# Patient Record
Sex: Male | Born: 1945 | Race: White | Hispanic: No | Smoking: Never smoker
Health system: Southern US, Community
[De-identification: ages and names within clinical notes are randomized; demographics above are authoritative.]

## PROBLEM LIST (undated history)

## (undated) DIAGNOSIS — F1010 Alcohol abuse, uncomplicated: Secondary | ICD-10-CM

## (undated) HISTORY — PX: STENT PLACEMENT VASCULAR (ARMC HX): HXRAD1737

---

## 2004-01-01 ENCOUNTER — Observation Stay (HOSPITAL_COMMUNITY): Admission: AD | Admit: 2004-01-01 | Discharge: 2004-01-02 | Payer: Self-pay | Admitting: Cardiology

## 2011-05-18 DIAGNOSIS — J13 Pneumonia due to Streptococcus pneumoniae: Secondary | ICD-10-CM | POA: Diagnosis not present

## 2011-07-13 DIAGNOSIS — I2510 Atherosclerotic heart disease of native coronary artery without angina pectoris: Secondary | ICD-10-CM | POA: Diagnosis not present

## 2011-07-13 DIAGNOSIS — R0989 Other specified symptoms and signs involving the circulatory and respiratory systems: Secondary | ICD-10-CM | POA: Diagnosis not present

## 2011-07-13 DIAGNOSIS — F17200 Nicotine dependence, unspecified, uncomplicated: Secondary | ICD-10-CM | POA: Diagnosis not present

## 2011-07-13 DIAGNOSIS — E785 Hyperlipidemia, unspecified: Secondary | ICD-10-CM | POA: Diagnosis not present

## 2011-07-13 DIAGNOSIS — Z79899 Other long term (current) drug therapy: Secondary | ICD-10-CM | POA: Diagnosis not present

## 2011-11-25 DIAGNOSIS — S61209A Unspecified open wound of unspecified finger without damage to nail, initial encounter: Secondary | ICD-10-CM | POA: Diagnosis not present

## 2011-11-25 DIAGNOSIS — W298XXA Contact with other powered powered hand tools and household machinery, initial encounter: Secondary | ICD-10-CM | POA: Diagnosis not present

## 2011-11-25 DIAGNOSIS — I2510 Atherosclerotic heart disease of native coronary artery without angina pectoris: Secondary | ICD-10-CM | POA: Diagnosis not present

## 2012-02-06 DIAGNOSIS — Z125 Encounter for screening for malignant neoplasm of prostate: Secondary | ICD-10-CM | POA: Diagnosis not present

## 2012-02-06 DIAGNOSIS — R05 Cough: Secondary | ICD-10-CM | POA: Diagnosis not present

## 2012-02-06 DIAGNOSIS — R197 Diarrhea, unspecified: Secondary | ICD-10-CM | POA: Diagnosis not present

## 2012-02-06 DIAGNOSIS — R1033 Periumbilical pain: Secondary | ICD-10-CM | POA: Diagnosis not present

## 2012-02-06 DIAGNOSIS — R634 Abnormal weight loss: Secondary | ICD-10-CM | POA: Diagnosis not present

## 2012-02-06 DIAGNOSIS — E785 Hyperlipidemia, unspecified: Secondary | ICD-10-CM | POA: Diagnosis not present

## 2012-02-07 DIAGNOSIS — R059 Cough, unspecified: Secondary | ICD-10-CM | POA: Diagnosis not present

## 2012-02-07 DIAGNOSIS — R1033 Periumbilical pain: Secondary | ICD-10-CM | POA: Diagnosis not present

## 2012-02-07 DIAGNOSIS — R05 Cough: Secondary | ICD-10-CM | POA: Diagnosis not present

## 2012-02-07 DIAGNOSIS — R918 Other nonspecific abnormal finding of lung field: Secondary | ICD-10-CM | POA: Diagnosis not present

## 2012-02-07 DIAGNOSIS — I708 Atherosclerosis of other arteries: Secondary | ICD-10-CM | POA: Diagnosis not present

## 2012-02-07 DIAGNOSIS — R634 Abnormal weight loss: Secondary | ICD-10-CM | POA: Diagnosis not present

## 2012-02-07 DIAGNOSIS — R197 Diarrhea, unspecified: Secondary | ICD-10-CM | POA: Diagnosis not present

## 2012-02-07 DIAGNOSIS — F17200 Nicotine dependence, unspecified, uncomplicated: Secondary | ICD-10-CM | POA: Diagnosis not present

## 2012-02-13 DIAGNOSIS — J4489 Other specified chronic obstructive pulmonary disease: Secondary | ICD-10-CM | POA: Diagnosis not present

## 2012-02-13 DIAGNOSIS — H6120 Impacted cerumen, unspecified ear: Secondary | ICD-10-CM | POA: Diagnosis not present

## 2012-02-13 DIAGNOSIS — J449 Chronic obstructive pulmonary disease, unspecified: Secondary | ICD-10-CM | POA: Diagnosis not present

## 2012-02-13 DIAGNOSIS — Z681 Body mass index (BMI) 19 or less, adult: Secondary | ICD-10-CM | POA: Diagnosis not present

## 2012-02-16 DIAGNOSIS — R197 Diarrhea, unspecified: Secondary | ICD-10-CM | POA: Diagnosis not present

## 2012-02-16 DIAGNOSIS — R634 Abnormal weight loss: Secondary | ICD-10-CM | POA: Diagnosis not present

## 2012-02-27 DIAGNOSIS — R197 Diarrhea, unspecified: Secondary | ICD-10-CM | POA: Diagnosis not present

## 2012-02-27 DIAGNOSIS — E785 Hyperlipidemia, unspecified: Secondary | ICD-10-CM | POA: Diagnosis not present

## 2012-02-27 DIAGNOSIS — R634 Abnormal weight loss: Secondary | ICD-10-CM | POA: Diagnosis not present

## 2012-02-27 DIAGNOSIS — I739 Peripheral vascular disease, unspecified: Secondary | ICD-10-CM | POA: Diagnosis not present

## 2012-02-27 DIAGNOSIS — I2510 Atherosclerotic heart disease of native coronary artery without angina pectoris: Secondary | ICD-10-CM | POA: Diagnosis not present

## 2012-02-27 DIAGNOSIS — F17200 Nicotine dependence, unspecified, uncomplicated: Secondary | ICD-10-CM | POA: Diagnosis not present

## 2012-02-27 DIAGNOSIS — R0989 Other specified symptoms and signs involving the circulatory and respiratory systems: Secondary | ICD-10-CM | POA: Diagnosis not present

## 2012-03-11 DIAGNOSIS — Z1211 Encounter for screening for malignant neoplasm of colon: Secondary | ICD-10-CM | POA: Diagnosis not present

## 2012-03-11 DIAGNOSIS — R197 Diarrhea, unspecified: Secondary | ICD-10-CM | POA: Diagnosis not present

## 2012-03-11 DIAGNOSIS — K5289 Other specified noninfective gastroenteritis and colitis: Secondary | ICD-10-CM | POA: Diagnosis not present

## 2012-03-11 DIAGNOSIS — K921 Melena: Secondary | ICD-10-CM | POA: Diagnosis not present

## 2012-04-18 DIAGNOSIS — K5289 Other specified noninfective gastroenteritis and colitis: Secondary | ICD-10-CM | POA: Diagnosis not present

## 2012-10-25 DIAGNOSIS — F17200 Nicotine dependence, unspecified, uncomplicated: Secondary | ICD-10-CM | POA: Diagnosis not present

## 2012-10-25 DIAGNOSIS — Z79899 Other long term (current) drug therapy: Secondary | ICD-10-CM | POA: Diagnosis not present

## 2012-10-25 DIAGNOSIS — E785 Hyperlipidemia, unspecified: Secondary | ICD-10-CM | POA: Diagnosis not present

## 2012-10-25 DIAGNOSIS — I739 Peripheral vascular disease, unspecified: Secondary | ICD-10-CM | POA: Diagnosis not present

## 2012-10-25 DIAGNOSIS — I2510 Atherosclerotic heart disease of native coronary artery without angina pectoris: Secondary | ICD-10-CM | POA: Diagnosis not present

## 2013-01-16 DIAGNOSIS — E785 Hyperlipidemia, unspecified: Secondary | ICD-10-CM | POA: Diagnosis not present

## 2013-01-16 DIAGNOSIS — I2510 Atherosclerotic heart disease of native coronary artery without angina pectoris: Secondary | ICD-10-CM | POA: Diagnosis not present

## 2013-06-25 DIAGNOSIS — Z79899 Other long term (current) drug therapy: Secondary | ICD-10-CM | POA: Diagnosis not present

## 2013-06-25 DIAGNOSIS — F17200 Nicotine dependence, unspecified, uncomplicated: Secondary | ICD-10-CM | POA: Diagnosis not present

## 2013-06-25 DIAGNOSIS — I2510 Atherosclerotic heart disease of native coronary artery without angina pectoris: Secondary | ICD-10-CM | POA: Diagnosis not present

## 2013-06-25 DIAGNOSIS — E785 Hyperlipidemia, unspecified: Secondary | ICD-10-CM | POA: Diagnosis not present

## 2013-06-25 DIAGNOSIS — I739 Peripheral vascular disease, unspecified: Secondary | ICD-10-CM | POA: Diagnosis not present

## 2013-07-22 DIAGNOSIS — I70219 Atherosclerosis of native arteries of extremities with intermittent claudication, unspecified extremity: Secondary | ICD-10-CM | POA: Diagnosis not present

## 2013-10-08 DIAGNOSIS — I2510 Atherosclerotic heart disease of native coronary artery without angina pectoris: Secondary | ICD-10-CM | POA: Diagnosis not present

## 2013-10-08 DIAGNOSIS — Z1331 Encounter for screening for depression: Secondary | ICD-10-CM | POA: Diagnosis not present

## 2013-10-08 DIAGNOSIS — Z9181 History of falling: Secondary | ICD-10-CM | POA: Diagnosis not present

## 2013-10-08 DIAGNOSIS — Z125 Encounter for screening for malignant neoplasm of prostate: Secondary | ICD-10-CM | POA: Diagnosis not present

## 2013-10-08 DIAGNOSIS — R634 Abnormal weight loss: Secondary | ICD-10-CM | POA: Diagnosis not present

## 2013-10-08 DIAGNOSIS — E785 Hyperlipidemia, unspecified: Secondary | ICD-10-CM | POA: Diagnosis not present

## 2013-11-17 DIAGNOSIS — F17200 Nicotine dependence, unspecified, uncomplicated: Secondary | ICD-10-CM | POA: Diagnosis not present

## 2013-11-17 DIAGNOSIS — Z79899 Other long term (current) drug therapy: Secondary | ICD-10-CM | POA: Diagnosis not present

## 2013-11-17 DIAGNOSIS — R634 Abnormal weight loss: Secondary | ICD-10-CM | POA: Diagnosis not present

## 2014-02-17 DIAGNOSIS — Z79899 Other long term (current) drug therapy: Secondary | ICD-10-CM | POA: Diagnosis not present

## 2014-02-17 DIAGNOSIS — J449 Chronic obstructive pulmonary disease, unspecified: Secondary | ICD-10-CM | POA: Diagnosis not present

## 2014-02-17 DIAGNOSIS — I2510 Atherosclerotic heart disease of native coronary artery without angina pectoris: Secondary | ICD-10-CM | POA: Diagnosis not present

## 2014-02-17 DIAGNOSIS — Z2821 Immunization not carried out because of patient refusal: Secondary | ICD-10-CM | POA: Diagnosis not present

## 2014-02-17 DIAGNOSIS — Z720 Tobacco use: Secondary | ICD-10-CM | POA: Diagnosis not present

## 2014-02-17 DIAGNOSIS — F17200 Nicotine dependence, unspecified, uncomplicated: Secondary | ICD-10-CM | POA: Diagnosis not present

## 2014-02-17 DIAGNOSIS — E785 Hyperlipidemia, unspecified: Secondary | ICD-10-CM | POA: Diagnosis not present

## 2014-06-18 DIAGNOSIS — Z681 Body mass index (BMI) 19 or less, adult: Secondary | ICD-10-CM | POA: Diagnosis not present

## 2014-06-18 DIAGNOSIS — M1990 Unspecified osteoarthritis, unspecified site: Secondary | ICD-10-CM | POA: Diagnosis not present

## 2014-06-18 DIAGNOSIS — Z720 Tobacco use: Secondary | ICD-10-CM | POA: Diagnosis not present

## 2014-06-18 DIAGNOSIS — Z79899 Other long term (current) drug therapy: Secondary | ICD-10-CM | POA: Diagnosis not present

## 2014-06-18 DIAGNOSIS — I2510 Atherosclerotic heart disease of native coronary artery without angina pectoris: Secondary | ICD-10-CM | POA: Diagnosis not present

## 2014-06-18 DIAGNOSIS — E785 Hyperlipidemia, unspecified: Secondary | ICD-10-CM | POA: Diagnosis not present

## 2014-06-18 DIAGNOSIS — J449 Chronic obstructive pulmonary disease, unspecified: Secondary | ICD-10-CM | POA: Diagnosis not present

## 2014-07-09 DIAGNOSIS — L578 Other skin changes due to chronic exposure to nonionizing radiation: Secondary | ICD-10-CM | POA: Diagnosis not present

## 2014-07-09 DIAGNOSIS — D225 Melanocytic nevi of trunk: Secondary | ICD-10-CM | POA: Diagnosis not present

## 2014-07-09 DIAGNOSIS — L820 Inflamed seborrheic keratosis: Secondary | ICD-10-CM | POA: Diagnosis not present

## 2014-07-09 DIAGNOSIS — L821 Other seborrheic keratosis: Secondary | ICD-10-CM | POA: Diagnosis not present

## 2014-07-30 DIAGNOSIS — L300 Nummular dermatitis: Secondary | ICD-10-CM | POA: Diagnosis not present

## 2014-07-30 DIAGNOSIS — L299 Pruritus, unspecified: Secondary | ICD-10-CM | POA: Diagnosis not present

## 2014-08-06 DIAGNOSIS — L300 Nummular dermatitis: Secondary | ICD-10-CM | POA: Diagnosis not present

## 2014-10-20 DIAGNOSIS — Z720 Tobacco use: Secondary | ICD-10-CM | POA: Diagnosis not present

## 2014-10-20 DIAGNOSIS — Z9181 History of falling: Secondary | ICD-10-CM | POA: Diagnosis not present

## 2014-10-20 DIAGNOSIS — Z79899 Other long term (current) drug therapy: Secondary | ICD-10-CM | POA: Diagnosis not present

## 2014-10-20 DIAGNOSIS — Z1389 Encounter for screening for other disorder: Secondary | ICD-10-CM | POA: Diagnosis not present

## 2014-10-20 DIAGNOSIS — J449 Chronic obstructive pulmonary disease, unspecified: Secondary | ICD-10-CM | POA: Diagnosis not present

## 2014-10-20 DIAGNOSIS — E785 Hyperlipidemia, unspecified: Secondary | ICD-10-CM | POA: Diagnosis not present

## 2014-10-20 DIAGNOSIS — Z681 Body mass index (BMI) 19 or less, adult: Secondary | ICD-10-CM | POA: Diagnosis not present

## 2014-10-20 DIAGNOSIS — I2510 Atherosclerotic heart disease of native coronary artery without angina pectoris: Secondary | ICD-10-CM | POA: Diagnosis not present

## 2015-02-22 DIAGNOSIS — I2510 Atherosclerotic heart disease of native coronary artery without angina pectoris: Secondary | ICD-10-CM | POA: Diagnosis not present

## 2015-02-22 DIAGNOSIS — Z79899 Other long term (current) drug therapy: Secondary | ICD-10-CM | POA: Diagnosis not present

## 2015-02-22 DIAGNOSIS — J449 Chronic obstructive pulmonary disease, unspecified: Secondary | ICD-10-CM | POA: Diagnosis not present

## 2015-02-22 DIAGNOSIS — Z720 Tobacco use: Secondary | ICD-10-CM | POA: Diagnosis not present

## 2015-02-22 DIAGNOSIS — Z125 Encounter for screening for malignant neoplasm of prostate: Secondary | ICD-10-CM | POA: Diagnosis not present

## 2015-02-22 DIAGNOSIS — E785 Hyperlipidemia, unspecified: Secondary | ICD-10-CM | POA: Diagnosis not present

## 2015-02-22 DIAGNOSIS — F17200 Nicotine dependence, unspecified, uncomplicated: Secondary | ICD-10-CM | POA: Diagnosis not present

## 2015-02-22 DIAGNOSIS — Z681 Body mass index (BMI) 19 or less, adult: Secondary | ICD-10-CM | POA: Diagnosis not present

## 2015-06-22 DIAGNOSIS — J449 Chronic obstructive pulmonary disease, unspecified: Secondary | ICD-10-CM | POA: Diagnosis not present

## 2015-06-22 DIAGNOSIS — Z681 Body mass index (BMI) 19 or less, adult: Secondary | ICD-10-CM | POA: Diagnosis not present

## 2015-06-22 DIAGNOSIS — I2510 Atherosclerotic heart disease of native coronary artery without angina pectoris: Secondary | ICD-10-CM | POA: Diagnosis not present

## 2015-06-22 DIAGNOSIS — E785 Hyperlipidemia, unspecified: Secondary | ICD-10-CM | POA: Diagnosis not present

## 2015-06-22 DIAGNOSIS — Z720 Tobacco use: Secondary | ICD-10-CM | POA: Diagnosis not present

## 2015-06-22 DIAGNOSIS — F17200 Nicotine dependence, unspecified, uncomplicated: Secondary | ICD-10-CM | POA: Diagnosis not present

## 2015-06-22 DIAGNOSIS — Z1389 Encounter for screening for other disorder: Secondary | ICD-10-CM | POA: Diagnosis not present

## 2015-06-22 DIAGNOSIS — Z79899 Other long term (current) drug therapy: Secondary | ICD-10-CM | POA: Diagnosis not present

## 2015-10-28 DIAGNOSIS — I2510 Atherosclerotic heart disease of native coronary artery without angina pectoris: Secondary | ICD-10-CM | POA: Diagnosis not present

## 2015-10-28 DIAGNOSIS — F17200 Nicotine dependence, unspecified, uncomplicated: Secondary | ICD-10-CM | POA: Diagnosis not present

## 2015-10-28 DIAGNOSIS — J449 Chronic obstructive pulmonary disease, unspecified: Secondary | ICD-10-CM | POA: Diagnosis not present

## 2015-10-28 DIAGNOSIS — M1990 Unspecified osteoarthritis, unspecified site: Secondary | ICD-10-CM | POA: Diagnosis not present

## 2015-10-28 DIAGNOSIS — Z1389 Encounter for screening for other disorder: Secondary | ICD-10-CM | POA: Diagnosis not present

## 2015-10-28 DIAGNOSIS — Z79899 Other long term (current) drug therapy: Secondary | ICD-10-CM | POA: Diagnosis not present

## 2015-10-28 DIAGNOSIS — E785 Hyperlipidemia, unspecified: Secondary | ICD-10-CM | POA: Diagnosis not present

## 2015-10-28 DIAGNOSIS — Z9181 History of falling: Secondary | ICD-10-CM | POA: Diagnosis not present

## 2015-10-28 DIAGNOSIS — Z720 Tobacco use: Secondary | ICD-10-CM | POA: Diagnosis not present

## 2015-10-28 DIAGNOSIS — Z681 Body mass index (BMI) 19 or less, adult: Secondary | ICD-10-CM | POA: Diagnosis not present

## 2016-01-12 ENCOUNTER — Emergency Department (HOSPITAL_COMMUNITY): Payer: Medicare Other

## 2016-01-12 ENCOUNTER — Encounter (HOSPITAL_COMMUNITY): Payer: Self-pay | Admitting: *Deleted

## 2016-01-12 ENCOUNTER — Emergency Department (HOSPITAL_COMMUNITY)
Admission: EM | Admit: 2016-01-12 | Discharge: 2016-01-12 | Disposition: A | Discharge status: Home / Self Care | Payer: Medicare Other | Attending: Emergency Medicine | Admitting: Emergency Medicine

## 2016-01-12 DIAGNOSIS — S61012A Laceration without foreign body of left thumb without damage to nail, initial encounter: Secondary | ICD-10-CM

## 2016-01-12 DIAGNOSIS — Y939 Activity, unspecified: Secondary | ICD-10-CM | POA: Diagnosis not present

## 2016-01-12 DIAGNOSIS — T148 Other injury of unspecified body region: Secondary | ICD-10-CM | POA: Diagnosis not present

## 2016-01-12 DIAGNOSIS — Y999 Unspecified external cause status: Secondary | ICD-10-CM | POA: Diagnosis not present

## 2016-01-12 DIAGNOSIS — W312XXA Contact with powered woodworking and forming machines, initial encounter: Secondary | ICD-10-CM | POA: Diagnosis not present

## 2016-01-12 DIAGNOSIS — S61412A Laceration without foreign body of left hand, initial encounter: Secondary | ICD-10-CM | POA: Diagnosis not present

## 2016-01-12 DIAGNOSIS — S61209A Unspecified open wound of unspecified finger without damage to nail, initial encounter: Secondary | ICD-10-CM | POA: Diagnosis not present

## 2016-01-12 DIAGNOSIS — Y929 Unspecified place or not applicable: Secondary | ICD-10-CM | POA: Insufficient documentation

## 2016-01-12 MED ORDER — ACETAMINOPHEN 500 MG PO TABS
1000.0000 mg | ORAL_TABLET | Freq: Once | ORAL | Status: AC
  Administered 2016-01-12: 1000 mg via ORAL
  Filled 2016-01-12: qty 2

## 2016-01-12 MED ORDER — LIDOCAINE HCL (PF) 1 % IJ SOLN
10.0000 mL | Freq: Once | INTRAMUSCULAR | Status: AC
  Administered 2016-01-12: 10 mL
  Filled 2016-01-12: qty 10

## 2016-01-12 MED ORDER — CEPHALEXIN 500 MG PO CAPS: 500.0000 mg | ORAL_CAPSULE | Freq: Four times a day (QID) | ORAL | 0 refills | Status: DC

## 2016-01-12 NOTE — ED Notes (Signed)
Bandage applied prior to discharge

## 2016-01-12 NOTE — ED Provider Notes (Signed)
[facility] DEPT Provider Note   CSN: [id number] Arrival date & time: 01/12/16  1354     History   Chief Complaint Chief Complaint  Patient presents with  . Hand Injury  . Laceration    HPI Jesse Hawkins is a 70 y.o. male.  Patient presents today with a laceration to the palmar aspect of the left thumb.  He reports that approximately 3 hours ago he cut his finger with a hand saw.  He states that EMS applied pressure to the area, which did control the bleeding.  He is currently on Plavix.  He does report some numbness of the distal aspect of the finger.  He reports that his tetanus is UTD.  Patient is right handed.  He has not taken anything for pain prior to arrival, but was given Tylenol upon arrival in the ED, which helped with the pain.      No past medical history on file.  There are no active problems to display for this patient.   No past surgical history on file.     Home Medications    Prior to Admission medications   Not on File    Family History No family history on file.  Social History Social History  Substance Use Topics  . Smoking status: Never Smoker  . Smokeless tobacco: Never Used  . Alcohol use Not on file     Allergies   Review of patient's allergies indicates not on file.   Review of Systems Review of Systems  All other systems reviewed and are negative.    Physical Exam Updated Vital Signs BP 137/69   Pulse (!) 57   SpO2 99%   Physical Exam  Constitutional: He appears well-developed and well-nourished.  HENT:  Head: Normocephalic and atraumatic.  Neck: Normal range of motion. Neck supple.  Cardiovascular: Normal rate, regular rhythm and normal heart sounds.   Pulmonary/Chest: Effort normal and breath sounds normal.  Musculoskeletal:  ROM of the thumb is limited at the IP joint  Neurological: He is alert.  Distal sensation of the left thumb is intact  Skin:  6 cm u shaped gaping laceration to the finger pad of  the left thumb.  Diffuse soft tissue swelling of the distal thumb.  Laceration is actively bleeding, but bleeding is controlled.  Psychiatric: He has a normal mood and affect.  Nursing note and vitals reviewed.    ED Treatments / Results  Labs (all labs ordered are listed, but only abnormal results are displayed) Labs Reviewed - No data to display  EKG  EKG Interpretation None       Radiology Dg Hand Complete Left  Result Date: 01/12/2016 CLINICAL DATA:  69 year old who lacerated the left thumb with a band saw earlier today. Initial encounter. EXAM: LEFT HAND - COMPLETE 3+ VIEW COMPARISON:  None. FINDINGS: Images were obtained with overlying bandage material. Soft tissue injury involving the thumb. No visible opaque foreign body in the soft tissues. No evidence of acute fracture or dislocation. Moderate narrowing of the IP joint space in the thumb and the IP joint spaces of all the fingers. MCP joint spaces well-preserved. Severe narrowing of the trapezium-1st metacarpal joint space with intra-articular loose bodies. IMPRESSION: 1. No acute osseous abnormality. 2. No visible opaque foreign bodies in the soft tissues. 3. 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 Electronically Signed   By: Evangeline Dakin M.D.   On: 01/12/2016 15:34  Procedures Procedures (including critical care time)  Medications Ordered in ED Medications  lidocaine (PF) (XYLOCAINE) 1 % injection 10 mL (not administered)  acetaminophen (TYLENOL) tablet 1,000 mg (1,000 mg Oral Given 01/12/16 1515)     Initial Impression / Assessment and Plan / ED Course  I have reviewed the triage vital signs and the nursing notes.  Pertinent labs & imaging results that were available during my care of the patient were reviewed by me and considered in my medical decision making (see chart for details).  Clinical Course   4:58  PM Patient discussed with LeAnn from Hand Surgery.  She states that the patient can follow up in the office tomorrow  Final Clinical Impressions(s) / ED Diagnoses   Final diagnoses:  None   Patient presents today with a laceration from a table saw to the palmar aspect of the left thumb.  Xray is negative.  No obvious tendon injury, but ROM limited at the IP joint of the thumb.  Distal sensation is intact.  Laceration is gaping with surrounding edema.  Unable to closely approximate the laceration due to swelling and the gaping nature of the wound.  Bleeding is controlled. Discussed with Hand Surgery.  Patient can follow up in the office tomorrow.  Patient placed on Keflex.  Stable for discharge.  Patient also evaluated by Dr Laverta Baltimore who is in agreement with the plan.   New Prescriptions New Prescriptions   No medications on file     Hyman Bible, PA-C 01/12/16 2326    Margette Fast, MD 01/13/16 [phone]

## 2016-01-12 NOTE — ED Triage Notes (Addendum)
Patient came in post table-saw injury to left thumb. Patient's thumb got caught in saw and now has circumfertial laceration to left thumb. EMS wrapped with kerlix. No loss of thumb tip and no bone showing per EMS. EMS states patient still has full movement. Patient states it's throbbing and numb. Patient takes plavix and aspirin daily.

## 2016-01-24 DIAGNOSIS — S61012A Laceration without foreign body of left thumb without damage to nail, initial encounter: Secondary | ICD-10-CM | POA: Diagnosis not present

## 2016-02-29 DIAGNOSIS — Z79899 Other long term (current) drug therapy: Secondary | ICD-10-CM | POA: Diagnosis not present

## 2016-02-29 DIAGNOSIS — E785 Hyperlipidemia, unspecified: Secondary | ICD-10-CM | POA: Diagnosis not present

## 2016-02-29 DIAGNOSIS — J449 Chronic obstructive pulmonary disease, unspecified: Secondary | ICD-10-CM | POA: Diagnosis not present

## 2016-02-29 DIAGNOSIS — I2510 Atherosclerotic heart disease of native coronary artery without angina pectoris: Secondary | ICD-10-CM | POA: Diagnosis not present

## 2016-02-29 DIAGNOSIS — M1990 Unspecified osteoarthritis, unspecified site: Secondary | ICD-10-CM | POA: Diagnosis not present

## 2016-02-29 DIAGNOSIS — Z681 Body mass index (BMI) 19 or less, adult: Secondary | ICD-10-CM | POA: Diagnosis not present

## 2016-04-03 DIAGNOSIS — M9903 Segmental and somatic dysfunction of lumbar region: Secondary | ICD-10-CM | POA: Diagnosis not present

## 2016-04-03 DIAGNOSIS — M9902 Segmental and somatic dysfunction of thoracic region: Secondary | ICD-10-CM | POA: Diagnosis not present

## 2016-04-03 DIAGNOSIS — M545 Low back pain: Secondary | ICD-10-CM | POA: Diagnosis not present

## 2016-04-03 DIAGNOSIS — M9905 Segmental and somatic dysfunction of pelvic region: Secondary | ICD-10-CM | POA: Diagnosis not present

## 2016-04-05 DIAGNOSIS — M9902 Segmental and somatic dysfunction of thoracic region: Secondary | ICD-10-CM | POA: Diagnosis not present

## 2016-04-05 DIAGNOSIS — M9903 Segmental and somatic dysfunction of lumbar region: Secondary | ICD-10-CM | POA: Diagnosis not present

## 2016-04-05 DIAGNOSIS — M9905 Segmental and somatic dysfunction of pelvic region: Secondary | ICD-10-CM | POA: Diagnosis not present

## 2016-04-05 DIAGNOSIS — M545 Low back pain: Secondary | ICD-10-CM | POA: Diagnosis not present

## 2016-04-07 DIAGNOSIS — M545 Low back pain: Secondary | ICD-10-CM | POA: Diagnosis not present

## 2016-04-07 DIAGNOSIS — M9905 Segmental and somatic dysfunction of pelvic region: Secondary | ICD-10-CM | POA: Diagnosis not present

## 2016-04-07 DIAGNOSIS — M9902 Segmental and somatic dysfunction of thoracic region: Secondary | ICD-10-CM | POA: Diagnosis not present

## 2016-04-07 DIAGNOSIS — M9903 Segmental and somatic dysfunction of lumbar region: Secondary | ICD-10-CM | POA: Diagnosis not present

## 2016-05-17 DIAGNOSIS — Z1211 Encounter for screening for malignant neoplasm of colon: Secondary | ICD-10-CM | POA: Diagnosis not present

## 2016-05-17 DIAGNOSIS — Z125 Encounter for screening for malignant neoplasm of prostate: Secondary | ICD-10-CM | POA: Diagnosis not present

## 2016-05-17 DIAGNOSIS — Z136 Encounter for screening for cardiovascular disorders: Secondary | ICD-10-CM | POA: Diagnosis not present

## 2016-05-17 DIAGNOSIS — Z9181 History of falling: Secondary | ICD-10-CM | POA: Diagnosis not present

## 2016-05-17 DIAGNOSIS — Z1389 Encounter for screening for other disorder: Secondary | ICD-10-CM | POA: Diagnosis not present

## 2016-05-17 DIAGNOSIS — Z0000 Encounter for general adult medical examination without abnormal findings: Secondary | ICD-10-CM | POA: Diagnosis not present

## 2016-06-28 DIAGNOSIS — J449 Chronic obstructive pulmonary disease, unspecified: Secondary | ICD-10-CM | POA: Diagnosis not present

## 2016-06-28 DIAGNOSIS — I2510 Atherosclerotic heart disease of native coronary artery without angina pectoris: Secondary | ICD-10-CM | POA: Diagnosis not present

## 2016-06-28 DIAGNOSIS — E785 Hyperlipidemia, unspecified: Secondary | ICD-10-CM | POA: Diagnosis not present

## 2016-06-28 DIAGNOSIS — Z125 Encounter for screening for malignant neoplasm of prostate: Secondary | ICD-10-CM | POA: Diagnosis not present

## 2016-06-28 DIAGNOSIS — Z681 Body mass index (BMI) 19 or less, adult: Secondary | ICD-10-CM | POA: Diagnosis not present

## 2016-06-28 DIAGNOSIS — M1990 Unspecified osteoarthritis, unspecified site: Secondary | ICD-10-CM | POA: Diagnosis not present

## 2016-06-28 DIAGNOSIS — Z720 Tobacco use: Secondary | ICD-10-CM | POA: Diagnosis not present

## 2016-06-28 DIAGNOSIS — Z79899 Other long term (current) drug therapy: Secondary | ICD-10-CM | POA: Diagnosis not present

## 2016-11-06 DIAGNOSIS — M1990 Unspecified osteoarthritis, unspecified site: Secondary | ICD-10-CM | POA: Diagnosis not present

## 2016-11-06 DIAGNOSIS — Z720 Tobacco use: Secondary | ICD-10-CM | POA: Diagnosis not present

## 2016-11-06 DIAGNOSIS — E785 Hyperlipidemia, unspecified: Secondary | ICD-10-CM | POA: Diagnosis not present

## 2016-11-06 DIAGNOSIS — Z79899 Other long term (current) drug therapy: Secondary | ICD-10-CM | POA: Diagnosis not present

## 2016-11-06 DIAGNOSIS — Z125 Encounter for screening for malignant neoplasm of prostate: Secondary | ICD-10-CM | POA: Diagnosis not present

## 2016-11-06 DIAGNOSIS — J449 Chronic obstructive pulmonary disease, unspecified: Secondary | ICD-10-CM | POA: Diagnosis not present

## 2016-11-06 DIAGNOSIS — M5432 Sciatica, left side: Secondary | ICD-10-CM | POA: Diagnosis not present

## 2016-11-06 DIAGNOSIS — Z681 Body mass index (BMI) 19 or less, adult: Secondary | ICD-10-CM | POA: Diagnosis not present

## 2016-11-06 DIAGNOSIS — I2510 Atherosclerotic heart disease of native coronary artery without angina pectoris: Secondary | ICD-10-CM | POA: Diagnosis not present

## 2017-03-12 DIAGNOSIS — M1990 Unspecified osteoarthritis, unspecified site: Secondary | ICD-10-CM | POA: Diagnosis not present

## 2017-03-12 DIAGNOSIS — I2510 Atherosclerotic heart disease of native coronary artery without angina pectoris: Secondary | ICD-10-CM | POA: Diagnosis not present

## 2017-03-12 DIAGNOSIS — M5432 Sciatica, left side: Secondary | ICD-10-CM | POA: Diagnosis not present

## 2017-03-12 DIAGNOSIS — Z720 Tobacco use: Secondary | ICD-10-CM | POA: Diagnosis not present

## 2017-03-12 DIAGNOSIS — Z681 Body mass index (BMI) 19 or less, adult: Secondary | ICD-10-CM | POA: Diagnosis not present

## 2017-03-12 DIAGNOSIS — Z87891 Personal history of nicotine dependence: Secondary | ICD-10-CM | POA: Diagnosis not present

## 2017-03-12 DIAGNOSIS — Z79899 Other long term (current) drug therapy: Secondary | ICD-10-CM | POA: Diagnosis not present

## 2017-03-12 DIAGNOSIS — J449 Chronic obstructive pulmonary disease, unspecified: Secondary | ICD-10-CM | POA: Diagnosis not present

## 2017-03-12 DIAGNOSIS — E785 Hyperlipidemia, unspecified: Secondary | ICD-10-CM | POA: Diagnosis not present

## 2017-07-10 DIAGNOSIS — Z720 Tobacco use: Secondary | ICD-10-CM | POA: Diagnosis not present

## 2017-07-10 DIAGNOSIS — M1990 Unspecified osteoarthritis, unspecified site: Secondary | ICD-10-CM | POA: Diagnosis not present

## 2017-07-10 DIAGNOSIS — M5432 Sciatica, left side: Secondary | ICD-10-CM | POA: Diagnosis not present

## 2017-07-10 DIAGNOSIS — Z9181 History of falling: Secondary | ICD-10-CM | POA: Diagnosis not present

## 2017-07-10 DIAGNOSIS — I2510 Atherosclerotic heart disease of native coronary artery without angina pectoris: Secondary | ICD-10-CM | POA: Diagnosis not present

## 2017-07-10 DIAGNOSIS — Z87891 Personal history of nicotine dependence: Secondary | ICD-10-CM | POA: Diagnosis not present

## 2017-07-10 DIAGNOSIS — J449 Chronic obstructive pulmonary disease, unspecified: Secondary | ICD-10-CM | POA: Diagnosis not present

## 2017-07-10 DIAGNOSIS — Z1331 Encounter for screening for depression: Secondary | ICD-10-CM | POA: Diagnosis not present

## 2017-07-10 DIAGNOSIS — Z1389 Encounter for screening for other disorder: Secondary | ICD-10-CM | POA: Diagnosis not present

## 2017-07-10 DIAGNOSIS — E785 Hyperlipidemia, unspecified: Secondary | ICD-10-CM | POA: Diagnosis not present

## 2017-07-10 DIAGNOSIS — Z6821 Body mass index (BMI) 21.0-21.9, adult: Secondary | ICD-10-CM | POA: Diagnosis not present

## 2017-09-21 ENCOUNTER — Emergency Department (HOSPITAL_COMMUNITY): Payer: Medicare Other | Admitting: Certified Registered"

## 2017-09-21 ENCOUNTER — Encounter (HOSPITAL_COMMUNITY): Payer: Self-pay

## 2017-09-21 ENCOUNTER — Emergency Department (HOSPITAL_COMMUNITY): Payer: Medicare Other

## 2017-09-21 ENCOUNTER — Other Ambulatory Visit: Payer: Self-pay

## 2017-09-21 ENCOUNTER — Inpatient Hospital Stay (HOSPITAL_COMMUNITY)
Admission: EM | Admit: 2017-09-21 | Discharge: 2017-09-22 | DRG: 581 | Disposition: A | Discharge status: Home / Self Care | Payer: Medicare Other | Attending: Orthopedic Surgery | Admitting: Orthopedic Surgery

## 2017-09-21 ENCOUNTER — Encounter (HOSPITAL_COMMUNITY)
Admission: EM | Disposition: A | Discharge status: Home / Self Care | Payer: Self-pay | Source: Home / Self Care | Attending: Orthopedic Surgery

## 2017-09-21 DIAGNOSIS — S6992XA Unspecified injury of left wrist, hand and finger(s), initial encounter: Secondary | ICD-10-CM | POA: Diagnosis not present

## 2017-09-21 DIAGNOSIS — R000 Tachycardia, unspecified: Secondary | ICD-10-CM | POA: Diagnosis not present

## 2017-09-21 DIAGNOSIS — S68127A Partial traumatic metacarpophalangeal amputation of left little finger, initial encounter: Secondary | ICD-10-CM | POA: Diagnosis not present

## 2017-09-21 DIAGNOSIS — S6990XA Unspecified injury of unspecified wrist, hand and finger(s), initial encounter: Secondary | ICD-10-CM

## 2017-09-21 DIAGNOSIS — W312XXA Contact with powered woodworking and forming machines, initial encounter: Secondary | ICD-10-CM

## 2017-09-21 DIAGNOSIS — Z7902 Long term (current) use of antithrombotics/antiplatelets: Secondary | ICD-10-CM

## 2017-09-21 DIAGNOSIS — F1010 Alcohol abuse, uncomplicated: Secondary | ICD-10-CM | POA: Diagnosis not present

## 2017-09-21 DIAGNOSIS — S61217A Laceration without foreign body of left little finger without damage to nail, initial encounter: Secondary | ICD-10-CM | POA: Diagnosis not present

## 2017-09-21 DIAGNOSIS — S68022A Partial traumatic metacarpophalangeal amputation of left thumb, initial encounter: Secondary | ICD-10-CM | POA: Diagnosis not present

## 2017-09-21 DIAGNOSIS — R279 Unspecified lack of coordination: Secondary | ICD-10-CM | POA: Diagnosis not present

## 2017-09-21 DIAGNOSIS — Z743 Need for continuous supervision: Secondary | ICD-10-CM | POA: Diagnosis not present

## 2017-09-21 DIAGNOSIS — M79643 Pain in unspecified hand: Secondary | ICD-10-CM | POA: Diagnosis not present

## 2017-09-21 DIAGNOSIS — S66822A Laceration of other specified muscles, fascia and tendons at wrist and hand level, left hand, initial encounter: Secondary | ICD-10-CM | POA: Diagnosis not present

## 2017-09-21 DIAGNOSIS — I2510 Atherosclerotic heart disease of native coronary artery without angina pectoris: Secondary | ICD-10-CM | POA: Diagnosis present

## 2017-09-21 DIAGNOSIS — S68117A Complete traumatic metacarpophalangeal amputation of left little finger, initial encounter: Secondary | ICD-10-CM | POA: Diagnosis not present

## 2017-09-21 DIAGNOSIS — S61412A Laceration without foreign body of left hand, initial encounter: Secondary | ICD-10-CM | POA: Diagnosis not present

## 2017-09-21 DIAGNOSIS — S6422XA Injury of radial nerve at wrist and hand level of left arm, initial encounter: Secondary | ICD-10-CM | POA: Diagnosis present

## 2017-09-21 DIAGNOSIS — S66125A Laceration of flexor muscle, fascia and tendon of left ring finger at wrist and hand level, initial encounter: Secondary | ICD-10-CM | POA: Diagnosis not present

## 2017-09-21 DIAGNOSIS — M79642 Pain in left hand: Secondary | ICD-10-CM | POA: Diagnosis present

## 2017-09-21 DIAGNOSIS — Z7982 Long term (current) use of aspirin: Secondary | ICD-10-CM

## 2017-09-21 DIAGNOSIS — M24842 Other specific joint derangements of left hand, not elsewhere classified: Secondary | ICD-10-CM | POA: Diagnosis not present

## 2017-09-21 DIAGNOSIS — R0902 Hypoxemia: Secondary | ICD-10-CM | POA: Diagnosis not present

## 2017-09-21 DIAGNOSIS — I10 Essential (primary) hypertension: Secondary | ICD-10-CM | POA: Diagnosis not present

## 2017-09-21 DIAGNOSIS — R52 Pain, unspecified: Secondary | ICD-10-CM | POA: Diagnosis not present

## 2017-09-21 DIAGNOSIS — S61419A Laceration without foreign body of unspecified hand, initial encounter: Secondary | ICD-10-CM | POA: Diagnosis present

## 2017-09-21 DIAGNOSIS — S648X2A Injury of other nerves at wrist and hand level of left arm, initial encounter: Secondary | ICD-10-CM | POA: Diagnosis not present

## 2017-09-21 DIAGNOSIS — Z955 Presence of coronary angioplasty implant and graft: Secondary | ICD-10-CM | POA: Diagnosis not present

## 2017-09-21 DIAGNOSIS — S6982XA Other specified injuries of left wrist, hand and finger(s), initial encounter: Secondary | ICD-10-CM | POA: Diagnosis not present

## 2017-09-21 HISTORY — PX: TENDON REPAIR: SHX5111

## 2017-09-21 HISTORY — DX: Alcohol abuse, uncomplicated: F10.10

## 2017-09-21 LAB — BASIC METABOLIC PANEL
ANION GAP: 8 (ref 5–15)
BUN: 10 mg/dL (ref 6–20)
CALCIUM: 8.2 mg/dL — AB (ref 8.9–10.3)
CO2: 22 mmol/L (ref 22–32)
Chloride: 110 mmol/L (ref 101–111)
Creatinine, Ser: 1.05 mg/dL (ref 0.61–1.24)
GFR calc non Af Amer: >60 mL/min (ref >60)
Glucose, Bld: 111 mg/dL — ABNORMAL HIGH (ref 65–99)
Potassium: 4.2 mmol/L (ref 3.5–5.1)
SODIUM: 140 mmol/L (ref 135–145)

## 2017-09-21 LAB — I-STAT CHEM 8, ED
BUN: 11 mg/dL (ref 6–20)
Calcium, Ion: 1.14 mmol/L — ABNORMAL LOW (ref 1.15–1.40)
Chloride: 107 mmol/L (ref 101–111)
Creatinine, Ser: 1 mg/dL (ref 0.61–1.24)
Glucose, Bld: 105 mg/dL — ABNORMAL HIGH (ref 65–99)
HEMATOCRIT: 33 % — AB (ref 39.0–52.0)
HEMOGLOBIN: 11.2 g/dL — AB (ref 13.0–17.0)
POTASSIUM: 4.2 mmol/L (ref 3.5–5.1)
SODIUM: 141 mmol/L (ref 135–145)
TCO2: 22 mmol/L (ref 22–32)

## 2017-09-21 LAB — TYPE AND SCREEN
ABO/RH(D): O POS
ANTIBODY SCREEN: NEGATIVE

## 2017-09-21 LAB — CBC
HEMATOCRIT: 33.7 % — AB (ref 39.0–52.0)
HEMOGLOBIN: 11.1 g/dL — AB (ref 13.0–17.0)
MCH: 31.1 pg (ref 26.0–34.0)
MCHC: 32.9 g/dL (ref 30.0–36.0)
MCV: 94.4 fL (ref 78.0–100.0)
Platelets: 147 10*3/uL — ABNORMAL LOW (ref 150–400)
RBC: 3.57 MIL/uL — ABNORMAL LOW (ref 4.22–5.81)
RDW: 14.3 % (ref 11.5–15.5)
WBC: 10.4 10*3/uL (ref 4.0–10.5)

## 2017-09-21 LAB — ABO/RH: ABO/RH(D): O POS

## 2017-09-21 SURGERY — TENDON REPAIR
Anesthesia: General | Site: Hand | Laterality: Left

## 2017-09-21 MED ORDER — LACTATED RINGERS IV SOLN
INTRAVENOUS | Status: DC
  Administered 2017-09-21 (×3): via INTRAVENOUS

## 2017-09-21 MED ORDER — EPHEDRINE SULFATE 50 MG/ML IJ SOLN
INTRAMUSCULAR | Status: DC | PRN
  Administered 2017-09-21 (×2): 5 mg via INTRAVENOUS

## 2017-09-21 MED ORDER — PHENYLEPHRINE 40 MCG/ML (10ML) SYRINGE FOR IV PUSH (FOR BLOOD PRESSURE SUPPORT)
PREFILLED_SYRINGE | INTRAVENOUS | Status: DC | PRN
  Administered 2017-09-21 (×2): 80 ug via INTRAVENOUS

## 2017-09-21 MED ORDER — ACETAMINOPHEN 325 MG PO TABS
650.0000 mg | ORAL_TABLET | Freq: Three times a day (TID) | ORAL | Status: DC
  Administered 2017-09-22 (×2): 650 mg via ORAL
  Filled 2017-09-21 (×2): qty 2

## 2017-09-21 MED ORDER — CEFAZOLIN SODIUM-DEXTROSE 1-4 GM/50ML-% IV SOLN
INTRAVENOUS | Status: AC
  Filled 2017-09-21: qty 50

## 2017-09-21 MED ORDER — PROPOFOL 10 MG/ML IV BOLUS
INTRAVENOUS | Status: DC | PRN
  Administered 2017-09-21: 120 mg via INTRAVENOUS

## 2017-09-21 MED ORDER — MIDAZOLAM HCL 5 MG/5ML IJ SOLN
INTRAMUSCULAR | Status: DC | PRN
  Administered 2017-09-21: 2 mg via INTRAVENOUS

## 2017-09-21 MED ORDER — CEFAZOLIN SODIUM-DEXTROSE 1-4 GM/50ML-% IV SOLN
INTRAVENOUS | Status: DC | PRN
  Administered 2017-09-21 (×2): 1 g via INTRAVENOUS

## 2017-09-21 MED ORDER — DEXAMETHASONE SODIUM PHOSPHATE 10 MG/ML IJ SOLN
INTRAMUSCULAR | Status: DC | PRN
  Administered 2017-09-21: 10 mg via INTRAVENOUS

## 2017-09-21 MED ORDER — ATORVASTATIN CALCIUM 20 MG PO TABS
20.0000 mg | ORAL_TABLET | Freq: Every day | ORAL | Status: DC
  Administered 2017-09-22: 20 mg via ORAL
  Filled 2017-09-21: qty 1

## 2017-09-21 MED ORDER — SUGAMMADEX SODIUM 200 MG/2ML IV SOLN
INTRAVENOUS | Status: DC | PRN
  Administered 2017-09-21: 120 mg via INTRAVENOUS

## 2017-09-21 MED ORDER — DEXTROSE 5 % IV SOLN
INTRAVENOUS | Status: DC | PRN
  Administered 2017-09-21: 50 ug/min via INTRAVENOUS

## 2017-09-21 MED ORDER — ONDANSETRON HCL 4 MG/2ML IJ SOLN
INTRAMUSCULAR | Status: DC | PRN
  Administered 2017-09-21: 4 mg via INTRAVENOUS

## 2017-09-21 MED ORDER — SODIUM CHLORIDE 0.9 % IV SOLN
INTRAVENOUS | Status: AC
  Filled 2017-09-21: qty 1.2

## 2017-09-21 MED ORDER — 0.9 % SODIUM CHLORIDE (POUR BTL) OPTIME
TOPICAL | Status: DC | PRN
  Administered 2017-09-21 (×2): 1000 mL

## 2017-09-21 MED ORDER — MORPHINE BOLUS VIA INFUSION: 1.0000 mg | INTRAVENOUS | Status: DC | PRN

## 2017-09-21 MED ORDER — HEPARIN SODIUM (PORCINE) 5000 UNIT/ML IJ SOLN
INTRAMUSCULAR | Status: DC | PRN
  Administered 2017-09-21: 20:00:00

## 2017-09-21 MED ORDER — DOCUSATE SODIUM 100 MG PO CAPS
100.0000 mg | ORAL_CAPSULE | Freq: Two times a day (BID) | ORAL | Status: DC
  Administered 2017-09-22: 100 mg via ORAL
  Filled 2017-09-21: qty 1

## 2017-09-21 MED ORDER — CLOPIDOGREL BISULFATE 75 MG PO TABS
75.0000 mg | ORAL_TABLET | Freq: Every day | ORAL | Status: DC
  Administered 2017-09-22: 75 mg via ORAL
  Filled 2017-09-21: qty 1

## 2017-09-21 MED ORDER — CEFAZOLIN SODIUM-DEXTROSE 2-4 GM/100ML-% IV SOLN
2.0000 g | Freq: Four times a day (QID) | INTRAVENOUS | Status: DC
  Administered 2017-09-22 (×2): 2 g via INTRAVENOUS
  Filled 2017-09-21 (×2): qty 100

## 2017-09-21 MED ORDER — FENTANYL CITRATE (PF) 250 MCG/5ML IJ SOLN
INTRAMUSCULAR | Status: AC
  Filled 2017-09-21: qty 5

## 2017-09-21 MED ORDER — CEFAZOLIN SODIUM-DEXTROSE 1-4 GM/50ML-% IV SOLN
1.0000 g | Freq: Once | INTRAVENOUS | Status: AC
  Administered 2017-09-21: 1 g via INTRAVENOUS
  Filled 2017-09-21: qty 50

## 2017-09-21 MED ORDER — BUPIVACAINE HCL (PF) 0.25 % IJ SOLN
INTRAMUSCULAR | Status: AC
  Filled 2017-09-21: qty 30

## 2017-09-21 MED ORDER — ONDANSETRON HCL 4 MG PO TABS: 4.0000 mg | ORAL_TABLET | Freq: Four times a day (QID) | ORAL | Status: DC | PRN

## 2017-09-21 MED ORDER — HYDROMORPHONE HCL 2 MG/ML IJ SOLN: 0.2500 mg | INTRAMUSCULAR | Status: DC | PRN

## 2017-09-21 MED ORDER — ROCURONIUM BROMIDE 100 MG/10ML IV SOLN
INTRAVENOUS | Status: DC | PRN
  Administered 2017-09-21: 50 mg via INTRAVENOUS

## 2017-09-21 MED ORDER — ONDANSETRON HCL 4 MG/2ML IJ SOLN: 4.0000 mg | Freq: Four times a day (QID) | INTRAMUSCULAR | Status: DC | PRN

## 2017-09-21 MED ORDER — LIDOCAINE HCL (CARDIAC) PF 100 MG/5ML IV SOSY
PREFILLED_SYRINGE | INTRAVENOUS | Status: DC | PRN
  Administered 2017-09-21: 60 mg via INTRAVENOUS

## 2017-09-21 MED ORDER — OXYCODONE HCL 5 MG PO TABS: 5.0000 mg | ORAL_TABLET | Freq: Four times a day (QID) | ORAL | Status: DC | PRN

## 2017-09-21 MED ORDER — FENTANYL CITRATE (PF) 100 MCG/2ML IJ SOLN
INTRAMUSCULAR | Status: DC | PRN
  Administered 2017-09-21: 100 ug via INTRAVENOUS
  Administered 2017-09-21 (×3): 50 ug via INTRAVENOUS

## 2017-09-21 MED ORDER — ARTIFICIAL TEARS OPHTHALMIC OINT
TOPICAL_OINTMENT | OPHTHALMIC | Status: DC | PRN
  Administered 2017-09-21: 1 via OPHTHALMIC

## 2017-09-21 MED ORDER — SODIUM CHLORIDE 0.9 % IV BOLUS
1000.0000 mL | Freq: Once | INTRAVENOUS | Status: AC
  Administered 2017-09-21: 1000 mL via INTRAVENOUS

## 2017-09-21 MED ORDER — TETANUS-DIPHTH-ACELL PERTUSSIS 5-2.5-18.5 LF-MCG/0.5 IM SUSP: 0.5000 mL | Freq: Once | INTRAMUSCULAR | Status: DC

## 2017-09-21 MED ORDER — ASPIRIN 81 MG PO CHEW
81.0000 mg | CHEWABLE_TABLET | Freq: Every day | ORAL | Status: DC
  Administered 2017-09-22: 81 mg via ORAL
  Filled 2017-09-21: qty 1

## 2017-09-21 SURGICAL SUPPLY — 47 items
ANCHOR JUGGERKNOT 1.0 1DR 2-0 (Anchor) ×3 IMPLANT
BAG DECANTER FOR FLEXI CONT (MISCELLANEOUS) ×3 IMPLANT
BLADE SURG 15 STRL LF DISP TIS (BLADE) ×1 IMPLANT
BLADE SURG 15 STRL SS (BLADE) ×2
BNDG COHESIVE 4X5 TAN STRL (GAUZE/BANDAGES/DRESSINGS) ×3 IMPLANT
BNDG ESMARK 4X9 LF (GAUZE/BANDAGES/DRESSINGS) IMPLANT
BNDG GAUZE ELAST 4 BULKY (GAUZE/BANDAGES/DRESSINGS) ×3 IMPLANT
CANISTER SUCT 3000ML PPV (MISCELLANEOUS) ×3 IMPLANT
CONNECTOR NERVE AXOGUARD 3X15 (Orthopedic Implant) ×3 IMPLANT
CORDS BIPOLAR (ELECTRODE) ×3 IMPLANT
COVER SURGICAL LIGHT HANDLE (MISCELLANEOUS) ×3 IMPLANT
CUFF TOURNIQUET SINGLE 18IN (TOURNIQUET CUFF) ×3 IMPLANT
DRAPE SURG 17X23 STRL (DRAPES) ×3 IMPLANT
DRSG ADAPTIC 3X8 NADH LF (GAUZE/BANDAGES/DRESSINGS) ×3 IMPLANT
ELECT REM PT RETURN 9FT ADLT (ELECTROSURGICAL) ×3
ELECTRODE REM PT RTRN 9FT ADLT (ELECTROSURGICAL) ×1 IMPLANT
GAUZE SPONGE 4X4 12PLY STRL (GAUZE/BANDAGES/DRESSINGS) ×3 IMPLANT
GLOVE BIO SURGEON STRL SZ7.5 (GLOVE) ×3 IMPLANT
GLOVE BIOGEL PI IND STRL 6.5 (GLOVE) ×2 IMPLANT
GLOVE BIOGEL PI IND STRL 8 (GLOVE) ×1 IMPLANT
GLOVE BIOGEL PI INDICATOR 6.5 (GLOVE) ×4
GLOVE BIOGEL PI INDICATOR 8 (GLOVE) ×2
GLOVE SURG SS PI 6.5 STRL IVOR (GLOVE) ×6 IMPLANT
GOWN STRL REUS W/ TWL XL LVL3 (GOWN DISPOSABLE) ×1 IMPLANT
GOWN STRL REUS W/TWL XL LVL3 (GOWN DISPOSABLE) ×2
GUIDE NERVE NEURAGEN 6MM (Tissue) ×1 IMPLANT
GUIDE NERVE NEURAGIEN 3MMX3CM (Tissue) ×1 IMPLANT
GUIDE NRV 3X2RSRB NEURAGEN 2M (Tissue) ×1 IMPLANT
KIT BASIN OR (CUSTOM PROCEDURE TRAY) ×3 IMPLANT
NERVE GUIDE NEURAGEN 2MM (Tissue) ×2 IMPLANT
NERVE GUIDE NEURAGEN 6MM (Tissue) ×3 IMPLANT
NERVE GUIDE NEURAGIEN 3MMX3CM (Tissue) ×3 IMPLANT
NS IRRIG 1000ML POUR BTL (IV SOLUTION) ×3 IMPLANT
PACK ORTHO EXTREMITY (CUSTOM PROCEDURE TRAY) ×3 IMPLANT
PAD CAST 4YDX4 CTTN HI CHSV (CAST SUPPLIES) ×1 IMPLANT
PADDING CAST COTTON 4X4 STRL (CAST SUPPLIES) ×2
SCRUB POVIDONE IODINE 4 OZ (MISCELLANEOUS) ×3 IMPLANT
SOL PREP POV-IOD 4OZ 10% (MISCELLANEOUS) ×3 IMPLANT
SUT ETHILON 8 0 BV130 4 (SUTURE) ×12 IMPLANT
SUT PROLENE 6 0 P 1 18 (SUTURE) ×3 IMPLANT
SUT VIC AB 3-0 FS2 27 (SUTURE) ×3 IMPLANT
SUT VICRYL RAPIDE 4/0 PS 2 (SUTURE) ×12 IMPLANT
SYR 3ML LL SCALE MARK (SYRINGE) ×3 IMPLANT
TOWEL OR 17X24 6PK STRL BLUE (TOWEL DISPOSABLE) ×3 IMPLANT
TUBE CONNECTING 12'X1/4 (SUCTIONS) ×1
TUBE CONNECTING 12X1/4 (SUCTIONS) ×2 IMPLANT
UNDERPAD 30X30 (UNDERPADS AND DIAPERS) ×3 IMPLANT

## 2017-09-21 NOTE — ED Provider Notes (Signed)
Patient placed in Quick Look pathway, seen and evaluated   Chief Complaint: L hand laceration  HPI:   72 year old male presents with L hand laceration due to a table saw injury at ~10AM this morning. He cut the palm of his L hand and tip of the L pinky finger off. He cannot move his pinky or ring finger. His middle and index fingers are numb. He takes Plavix and ASA daily for CAD. He went to [facility] but was sent here for hand surgery consult.   ROS: +laceration  Physical Exam:   Gen: No distress  Neuro: Awake and Alert  Skin: Warm    Focused Exam: L Hand: Large laceration over the palm. Open fracture of the L pinky finger. Decreased sensation of finger tips. 1+ radial pulse   Plan: Labs, type and screen, xray, 1L NS, ancef, hand consult   Initiation of care has begun. The patient has been counseled on the process, plan, and necessity for staying for the completion/evaluation, and the remainder of the medical screening examination    Recardo Evangelist, PA-C 09/21/17 Penhook, Kevin, MD 09/21/17 [phone]

## 2017-09-21 NOTE — Anesthesia Procedure Notes (Signed)
Procedure Name: Intubation Date/Time: 09/21/2017 6:18 PM Performed by: Josephine Igo, CRNA Pre-anesthesia Checklist: Patient identified, Emergency Drugs available and Suction available Patient Re-evaluated:Patient Re-evaluated prior to induction Oxygen Delivery Method: Circle system utilized Preoxygenation: Pre-oxygenation with 100% oxygen Induction Type: IV induction Ventilation: Mask ventilation without difficulty Laryngoscope Size: Miller and 2 Grade View: Grade I Tube type: Oral Tube size: 8.0 mm Number of attempts: 1 Airway Equipment and Method: Stylet Placement Confirmation: ETT inserted through vocal cords under direct vision,  positive ETCO2 and breath sounds checked- equal and bilateral Secured at: 23 cm Tube secured with: Tape Dental Injury: Teeth and Oropharynx as per pre-operative assessment

## 2017-09-21 NOTE — Transfer of Care (Signed)
Immediate Anesthesia Transfer of Care Note  Patient: Jesse Hawkins  Procedure(s) Performed: LEFT HAND TENDON, NERVE, ARTERY, AND LACERATION REPAIR, REVISION AMPUTATION OF LEFT SMALL FINGER (Left Hand)  Patient Location: PACU  Anesthesia Type:General  Level of Consciousness: drowsy, patient cooperative and responds to stimulation  Airway & Oxygen Therapy: Patient Spontanous Breathing  Post-op Assessment: Report given to RN and Post -op Vital signs reviewed and stable  Post vital signs: Reviewed and stable  Last Vitals:  Vitals Value Taken Time  BP    Temp    Pulse 84 09/21/2017 10:31 PM  Resp    SpO2 98 % 09/21/2017 10:31 PM  Vitals shown include unvalidated device data.  Last Pain:  Vitals:   09/21/17 1505  TempSrc: Oral  PainSc: 7          Complications: No apparent anesthesia complications

## 2017-09-21 NOTE — Anesthesia Preprocedure Evaluation (Signed)
Anesthesia Evaluation  Patient identified by MRN, date of birth, ID band Patient awake    Reviewed: Allergy & Precautions, H&P , NPO status , Patient's Chart, lab work & pertinent test results  Airway Mallampati: II  TM Distance: >3 FB Neck ROM: Full    Dental no notable dental hx. (+) Edentulous Upper, Partial Lower, Poor Dentition, Dental Advisory Given   Pulmonary neg pulmonary ROS,    Pulmonary exam normal breath sounds clear to auscultation       Cardiovascular + CAD and + Cardiac Stents   Rhythm:Regular Rate:Normal     Neuro/Psych negative neurological ROS  negative psych ROS   GI/Hepatic negative GI ROS, Neg liver ROS,   Endo/Other  negative endocrine ROS  Renal/GU negative Renal ROS  negative genitourinary   Musculoskeletal   Abdominal   Peds  Hematology negative hematology ROS (+)   Anesthesia Other Findings   Reproductive/Obstetrics negative OB ROS                             Anesthesia Physical Anesthesia Plan  ASA: III and emergent  Anesthesia Plan: General   Post-op Pain Management:    Induction: Intravenous, Rapid sequence and Cricoid pressure planned  PONV Risk Score and Plan: 3 and Ondansetron, Dexamethasone and Treatment may vary due to age or medical condition  Airway Management Planned: Oral ETT  Additional Equipment:   Intra-op Plan:   Post-operative Plan: Extubation in OR  Informed Consent: I have reviewed the patients History and Physical, chart, labs and discussed the procedure including the risks, benefits and alternatives for the proposed anesthesia with the patient or authorized representative who has indicated his/her understanding and acceptance.   Dental advisory given  Plan Discussed with: CRNA  Anesthesia Plan Comments:         Anesthesia Quick Evaluation

## 2017-09-21 NOTE — Op Note (Signed)
09/21/2017  10:36 PM  PATIENT:  Jesse Hawkins  72 y.o. male  PRE-OPERATIVE DIAGNOSIS:  Left hand tablesaw injury  POST-OPERATIVE DIAGNOSIS:  Same  PROCEDURE:   1. Excisional debridement of S/SQ/M of left hand    2. Revision amputation L SF thru distal P2, with local flap re-arrangement closure    3. Surgical repair of digital nerves in the left hand with conduit x 4 (RDN & UDN thumb, RDN IF, CDN to 3rd web)    4. Microsurgical repair of CDA to the 3rd web space with autologous reversed vein graft    5. L RF FDS excision in the palm/digit    6. L RF FDP repair in proximal zone 2    7. L RF MCP volar capsular repair    8. L hand complex laceration closure 16cm  SURGEON: Rayvon Char. Grandville Silos, MD  PHYSICIAN ASSISTANT: none  ANESTHESIA:  general  SPECIMENS:  None  DRAINS:   None  EBL:  less than 100 mL  PREOPERATIVE INDICATIONS:  [name] is a  72 y.o. male with a complex left hand tablesaw injury.  The risks benefits and alternatives were discussed with the patient preoperatively including but not limited to the risks of infection, bleeding, nerve injury, cardiopulmonary complications, the need for revision surgery, among others, and the patient verbalized understanding and consented to proceed.  OPERATIVE IMPLANTS: mini-juggerknot x 1, neurogen nerve conduits x 3, axogen nerve conduit x 1  OPERATIVE PROCEDURE:  After receiving prophylactic antibiotics, the patient was escorted to the operative theatre and placed in a supine position.  General anesthesia was administered.  A surgical "time-out" was performed during which the planned procedure, proposed operative site, and the correct patient identity were compared to the operative consent and agreement confirmed by the circulating nurse according to current facility policy.  Following application of a tourniquet to the operative extremity, the exposed skin was pre-scrubbed with a Hibiclens scrub brush before being formally prepped  with Betadine and draped in the usual sterile fashion.  The tourniquet inflated to approximately 157mmHg higher than systolic BP.  Survey of the injury was initiated.  The wounds were copiously irrigated.  There was an oblique laceration of variable depth across the palmar surface of the left hand, from the fourth webspace across the palm, creating a proximally based flap at the thenar eminence with the apex distally on the volar surface of the thumb.  The structures found to be transected including both digital nerves to the thumb, radial digital nerve to the index finger, the common digital nerve to the third webspace, the common digital artery to the third webspace as well as the ulnar digital artery to the ring finger, both flexor tendons to the ring finger, the volar capsule of the ring finger and a portion of the base of the proximal phalanx of the ring finger (which was actually missing), and the extensor apparatus of the small finger overlying P2, with only a small portion of the distal phalanx of the small finger remaining.  All the digits appeared adequately vascularized, although capillary refill of the ring finger was sluggish.  Due to the marginal blood supply to the ring finger, decision was made to proceed with repair of all structures in this initial setting, rather than as a staged outpatient procedure.  The small finger amputation was revised first, excising the distal phalanx and the condyles of the middle phalanx, debriding the remaining skin edges which were jagged and irregular, and then  closing the amputation by rearranging local tissue to form the appropriate flaps.  Once the small finger amputation revision had been completed, attention was directed to the ring finger.  The volar capsule was repaired with a mini juggernaut.  It was firmly anchored already to the metacarpal, but had been stripped away from the base of the proximal phalanx as the sawblade also removed a good portion of the  volar base of the phalanx itself.  A mini juggernaut anchor was placed into the raw bone surface at the base of the phalanx and this was used to repair the volar capsule.  Subsequently, the flexor digitorum superficialis to the ring finger was excised, both as much of it is could be brought forward from the base of the finger as well as some of it in the palm.  FDP was then repaired using 2 oh max braid suture in a 2 strand modified Kessler configuration followed by 6-0 running locked epitendinous Prolene suture.  Attention was then shifted to improving vascularity to the ring finger.  The tourniquet was released, and the proximal and distal aspects of the common artery to the third webspace was debrided to have good ends.  I initially tried to harvest vein from the volar aspect of the forearm distally and then proximally, and the veins were of poor quality.  I shifted attention to the dorsal aspect of the forearm and harvested and appropriate vein which was reversed, and then repaired with 8-0 nylon suture with standard microsurgical technique, employing interrupted sutures.  Flow was established and patency test revealed good flow across both anastomoses.  Next attention was shifted to the nerve injuries.  The stumps were identified, resected back to healthier nerve, and repaired with conduits.  3 mm conduit was used for the common digital nerve to the third webspace as well as the radial index digital nerve.  2 mm conduits were used for the radial and ulnar digital nerves to the thumb.  8-0 nylon was used to secure the and tubulated nerve ends into the conduits.  All the wounds were then again copiously irrigated and skin reapproximated with 4-0 Vicryl Rapide suture, to include closure of the surgically created incisions both in the mid palm used to expose the common digital nerve and artery stumps to the third webspace as well as the incisions in the forearm to harvest vein graft.  A fluffy dressing was  then applied without plaster, and he was awakened and taken to the recovery room in stable condition, breathing spontaneously.  DISPOSITION: He will be kept overnight for neurovascular monitoring and additional IV antibiotics before possibly being discharged on 09-22-17.

## 2017-09-21 NOTE — ED Provider Notes (Signed)
[facility] EMERGENCY DEPARTMENT Provider Note   CSN: [id number] Arrival date & time: 09/21/17  1501     History   Chief Complaint Chief Complaint  Patient presents with  . Hand Injury    HPI Jesse Hawkins is a 72 y.o. male.  HPI   Patient is a 72 year old male presenting with the patient is a transfer for hand injury to the left.  Patient was using a table saw and had sustained a large injury to left hand.  Unable to move pinky finger or fourth finger.  Laceration is to the palmar side.  Diffuse swelling.  Tetanus given prior to arrival.  Patient has not eaten today.  On Plavix and aspirin last taken at 6 AM this morning.  Patient is right-handed.  No past medical history on file.  There are no active problems to display for this patient.       Home Medications    Prior to Admission medications   Medication Sig Start Date End Date Taking? Authorizing Provider  atorvastatin (LIPITOR) 20 MG tablet Take 20 mg by mouth daily. 07/11/17   [provider]  cephALEXin (KEFLEX) 500 MG capsule Take 1 capsule (500 mg total) by mouth 4 (four) times daily. Patient not taking: Reported on 09/21/2017 01/12/16   Hyman Bible, PA-C    Family History No family history on file.  Social History Social History   Tobacco Use  . Smoking status: Never Smoker  . Smokeless tobacco: Never Used  Substance Use Topics  . Alcohol use: Not on file  . Drug use: Not on file     Allergies   Patient has no allergy information on record.   Review of Systems Review of Systems  Constitutional: Negative for activity change.  Respiratory: Negative for shortness of breath.   Cardiovascular: Negative for chest pain.  All other systems reviewed and are negative.    Physical Exam Updated Vital Signs BP (!) 111/51   Pulse 69   Temp 97.8 F (36.6 C) (Oral)   Ht 5\' 10"  (1.778 m)   Wt 61.2 kg (135 lb)   SpO2 100%   BMI 19.37 kg/m   Physical Exam    Constitutional: He is oriented to person, place, and time. He appears well-nourished.  HENT:  Head: Normocephalic.  Eyes: Conjunctivae are normal.  Cardiovascular: Normal rate.  Pulmonary/Chest: Effort normal.  Musculoskeletal:  See picture for description.  Sensation in tact on thumb and index and middle finger.  4th finger straightened, no ability to flex. 5th finger flexed, unable to straighten, missing sig tissue. Decreased sensation in 4th and 5th.     Neurological: He is oriented to person, place, and time.  Skin: Skin is warm and dry. He is not diaphoretic.  Psychiatric: He has a normal mood and affect. His behavior is normal.     ED Treatments / Results  Labs (all labs ordered are listed, but only abnormal results are displayed) Labs Reviewed  CBC - Abnormal; Notable for the following components:      Result Value   RBC 3.57 (*)    Hemoglobin 11.1 (*)    HCT 33.7 (*)    Platelets 147 (*)    All other components within normal limits  I-STAT CHEM 8, ED - Abnormal; Notable for the following components:   Glucose, Bld 105 (*)    Calcium, Ion 1.14 (*)    Hemoglobin 11.2 (*)    HCT 33.0 (*)    All other  components within normal limits  BASIC METABOLIC PANEL  TYPE AND SCREEN    EKG None  Radiology No results found.  Procedures Procedures (including critical care time)              Medications Ordered in ED Medications  Tdap (BOOSTRIX) injection 0.5 mL (0.5 mLs Intramuscular Not Given 09/21/17 1541)  ceFAZolin (ANCEF) IVPB 1 g/50 mL premix (has no administration in time range)  sodium chloride 0.9 % bolus 1,000 mL (0 mLs Intravenous Stopped 09/21/17 1550)     Initial Impression / Assessment and Plan / ED Course  I have reviewed the triage vital signs and the nursing notes.  Pertinent labs & imaging results that were available during my care of the patient were reviewed by me and considered in my medical decision making (see chart for  details).     Patient is a 72 year old male presenting with the patient is a transfer for hand injury to the left.  Patient was using a table saw and had sustained a large injury to left hand.  Unable to move pinky finger or fourth finger.  Laceration is to the palmar side.  Diffuse swelling.  Tetanus given prior to arrival.  Patient has not eaten today.  On Plavix and aspirin last taken at 6 AM this morning.  Patient is right-handed.  Discussed with Dr. Grandville Silos.  He will review images and call me back.   Upon review Dr. Grandville Silos will bring him to the OR.    Final Clinical Impressions(s) / ED Diagnoses   Final diagnoses:  None    ED Discharge Orders    None       Macarthur Critchley, MD 09/21/17 2028

## 2017-09-21 NOTE — ED Triage Notes (Signed)
Pt transferred from [facility] ED, transported via [facility].  Pt has a large laceration to anterior left hand caused by a table saw. Pt reports 5th digit is missing to the top knuckle. Pt is A&Ox4.  NAD noted.  Wound is dressed and saturated in bright red blood upon arrival to ED.

## 2017-09-21 NOTE — Consult Note (Signed)
ORTHOPAEDIC CONSULTATION HISTORY & PHYSICAL REQUESTING PHYSICIAN: ED MD  Chief Complaint: left hand tablesaw injury  HPI: Jesse Hawkins is a 72 y.o. male who was injured by a tablesaw earlier today. He was evaluated initially in the ED at [facility], and accepted in transfer to [facility] by the ED MD.  The orthopaedic surgeon on-call at [facility] did not personally evaluate the patient.  The patient reports that he has had local anesthetic already infiltrated, and the left hand is bandaged.  Past Medical History:  Diagnosis Date  . Alcohol abuse    Past Surgical History:  Procedure Laterality Date  . STENT PLACEMENT VASCULAR (Akins HX)     Social History   Socioeconomic History  . Marital status: Divorced    Spouse name: Not on file  . Number of children: Not on file  . Years of education: Not on file  . Highest education level: Not on file  Occupational History  . Not on file  Social Needs  . Financial resource strain: Not on file  . Food insecurity:    Worry: Not on file    Inability: Not on file  . Transportation needs:    Medical: Not on file    Non-medical: Not on file  Tobacco Use  . Smoking status: Never Smoker  . Smokeless tobacco: Never Used  Substance and Sexual Activity  . Alcohol use: Not on file  . Drug use: Not on file  . Sexual activity: Not on file  Lifestyle  . Physical activity:    Days per week: Not on file    Minutes per session: Not on file  . Stress: Not on file  Relationships  . Social connections:    Talks on phone: Not on file    Gets together: Not on file    Attends religious service: Not on file    Active member of club or organization: Not on file    Attends meetings of clubs or organizations: Not on file    Relationship status: Not on file  Other Topics Concern  . Not on file  Social History Narrative  . Not on file   History reviewed. No pertinent family history. No Known Allergies Prior to Admission medications   Medication Sig  Start Date End Date Taking? Authorizing Provider  aspirin 81 MG chewable tablet Chew 81 mg by mouth daily.   Yes [provider]  atorvastatin (LIPITOR) 20 MG tablet Take 20 mg by mouth daily. 07/11/17  Yes [provider]  clopidogrel (PLAVIX) 75 MG tablet Take 75 mg by mouth daily.   Yes [provider]  cephALEXin (KEFLEX) 500 MG capsule Take 1 capsule (500 mg total) by mouth 4 (four) times daily. Patient not taking: Reported on 09/21/2017 01/12/16   Hyman Bible, PA-C   Dg Hand Complete Left  Result Date: 09/21/2017 CLINICAL DATA:  Table saw injury EXAM: LEFT HAND - COMPLETE 3+ VIEW COMPARISON:  The same day film 09/21/2017 FINDINGS: As previously described there is a traumatic destruction along the dorsal surface of the middle and distal phalanx of the fifth digit. There is involvement of the distal and proximal interphalangeal joint of the fifth digit. There is comminution of the distal phalanx. Soft tissue injury. IMPRESSION: A traumatic injury to the middle and distal phalanx of the fifth digit with bone loss from both these phalanges in comminution of distal phalanx. Electronically Signed   By: Suzy Bouchard M.D.   On: 09/21/2017 16:26    Positive ROS:  All other systems have been reviewed and were otherwise negative with the exception of those mentioned in the HPI and as above.  Physical Exam: Vitals: Refer to EMR. Constitutional:  WD, WN, NAD HEENT:  NCAT, EOMI Neuro/Psych:  Alert & oriented to person, place, and time; appropriate mood & affect Lymphatic: No generalized extremity edema or lymphadenopathy Extremities / MSK:  The extremities are normal with respect to appearance, ranges of motion, joint stability, muscle strength/tone, sensation, & perfusion except as otherwise noted:  The left hand is bandaged.  The ring finger sits entirely extended, lacking any flexion tone.  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  The remainder of the small finger appears to be adequately vascularized.  He has numbness across all the digits, but has had local anesthetic.  There is a oblique trans-palmar laceration of variable depth, but there appears to be intact flexor tendons to the index and long fingers, and adequate vascularity of all the digits.  The small finger sits in a flexed posture, with dorsal skin lacerations  Assessment: Complex left hand trauma, with a oblique trans-palmar volar wound, dorsal wounds to the small finger and tip amputation, and likely flexor tendon injuries to the ring finger  Plan: I discussed with him the need for exploration under anesthesia, with irrigation and wound closure, in addition to surveying the damage to arrive at a appropriate reconstruction plan.  I am reluctant to perform flexor tendon repair in the setting, and will likely return to the operating room at a different time, within the next week or so.  However, will defer definitive decision-making until intraoperative assessment can be completed.  Questions were invited and answered and consent obtained.  Rayvon Char [address], [place], [place]  [address] Office: [phone] Mobile: [phone]  09/21/2017, 6:09 PM

## 2017-09-22 ENCOUNTER — Other Ambulatory Visit: Payer: Self-pay

## 2017-09-22 MED ORDER — MORPHINE SULFATE (PF) 2 MG/ML IV SOLN: 1.0000 mg | INTRAVENOUS | Status: DC | PRN

## 2017-09-22 MED ORDER — CEFAZOLIN SODIUM-DEXTROSE 1-4 GM/50ML-% IV SOLN
1.0000 g | Freq: Four times a day (QID) | INTRAVENOUS | Status: AC
  Administered 2017-09-22: 1 g via INTRAVENOUS
  Filled 2017-09-22: qty 50

## 2017-09-22 MED ORDER — ACETAMINOPHEN 325 MG PO TABS: 650.0000 mg | ORAL_TABLET | Freq: Three times a day (TID) | ORAL | Status: AC

## 2017-09-22 MED ORDER — CEPHALEXIN 500 MG PO CAPS: 500.0000 mg | ORAL_CAPSULE | Freq: Two times a day (BID) | ORAL | 0 refills | Status: AC

## 2017-09-22 NOTE — Progress Notes (Signed)
  PATIENT ID: Jesse Hawkins  MRN: [id number]  DOB/AGE:  Apr 10, 1946 / 72 y.o.  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  No c/o chest pain or SOB.   Has been wiggling fingers.   Objective: Vital signs in last 24 hours: Temp:  [97.7 F (36.5 C)-98.1 F (36.7 C)] 98 F (36.7 C) (06/08 0641) Pulse Rate:  [56-111] 56 (06/08 0641) Resp:  [14-20] 17 (06/08 0641) BP: (80-141)/(38-114) 116/52 (06/08 0641) SpO2:  [95 %-100 %] 98 % (06/08 0641) Weight:  [61.2 kg (135 lb)] 61.2 kg (135 lb) (06/07 2340)  Intake/Output from previous day: 06/07 0701 - 06/08 0700 In: 2270 [P.O.:120; I.V.:1000; IV Piggyback:1150] Out: 525 [Urine:225; Blood:300] Intake/Output this shift: No intake/output data recorded.  Recent Labs    09/21/17 1525 09/21/17 1536  HGB 11.1* 11.2*   Recent Labs    09/21/17 1525 09/21/17 1536  WBC 10.4  --   RBC 3.57*  --   HCT 33.7* 33.0*  PLT 147*  --    Recent Labs    09/21/17 1525 09/21/17 1536  NA 140 141  K 4.2 4.2  CL 110 107  CO2 22  --   BUN 10 11  CREATININE 1.05 1.00  GLUCOSE 111* 105*  CALCIUM 8.2*  --    No results for input(s): LABPT, INR in the last 72 hours.  Physical Exam: bandage intact, all exposed digits warm with good CR, including RF, which rests more flexed than other digits.  SF remains unexposed. Decreased sens to LT across all digits.  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 [address], [place], [place]  [address] Office:  [phone] Mobile: [phone]  09/22/2017, 7:18 AM

## 2017-09-22 NOTE — Progress Notes (Signed)
Left hand dressing with ace wrap dry and intact. Able to wiggle fingers. Discharge instructions given. Discharged to home accompanied by son.

## 2017-09-22 NOTE — Discharge Instructions (Signed)
Discharge Instructions   Wiggle your fingers very gently in the bandage. Elevate your hand to reduce pain & swelling of the digits.  Ice over the operative site may be helpful to reduce pain & swelling.  DO NOT USE HEAT. Leave the dressing in place until you return to our office.  You may shower, but keep the bandage clean & dry.  You may drive a car when you are off of prescription pain medications and can safely control your vehicle with both hands. Our office will call you to arrange follow-up   Please call [phone] during normal business hours or [phone] after hours for any problems. Including the following:  - excessive redness of the incisions - drainage for more than 4 days - fever of more than 101.5 F  *Please note that pain medications will not be refilled after hours or on weekends.

## 2017-09-22 NOTE — Anesthesia Postprocedure Evaluation (Signed)
Anesthesia Post Note  Patient: Jesse Hawkins  Procedure(s) Performed: LEFT HAND TENDON, NERVE, ARTERY, AND LACERATION REPAIR, REVISION AMPUTATION OF LEFT SMALL FINGER (Left Hand)     Patient location during evaluation: PACU Anesthesia Type: General Level of consciousness: awake and alert Pain management: pain level controlled Vital Signs Assessment: post-procedure vital signs reviewed and stable Respiratory status: spontaneous breathing, nonlabored ventilation and respiratory function stable Cardiovascular status: blood pressure returned to baseline and stable Postop Assessment: no apparent nausea or vomiting Anesthetic complications: no    Last Vitals:  Vitals:   09/21/17 2300 09/21/17 2340  BP: 126/67 125/76  Pulse: 86 90  Resp: 17 18  Temp: 36.7 C 36.7 C  SpO2: 96% 96%    Last Pain:  Vitals:   09/22/17 0118  TempSrc:   PainSc: 0-No pain                 Rawn Quiroa,W. EDMOND

## 2017-09-22 NOTE — Discharge Summary (Signed)
Physician Discharge Summary  Patient ID: Jesse Hawkins MRN: [id number] DOB/AGE: 1945/08/02 72 y.o.  Admit date: 09/21/2017 Discharge date: 09/22/2017  Admission Diagnoses:  Left hand tablesaw injury  Discharge Diagnoses:  Active Problems:   Hand laceration   Hand injury, left, initial encounter   Past Medical History:  Diagnosis Date  . Alcohol abuse     Surgeries: Procedure(s): LEFT HAND TENDON, NERVE, ARTERY, AND LACERATION REPAIR, REVISION AMPUTATION OF LEFT SMALL FINGER on 09/21/2017   Consultants (if any):   Discharged Condition: Improved  Hospital Course: Jesse Hawkins is an 72 y.o. male who was admitted 09/21/2017 with a diagnosis of left hand tablesaw injury and went to the operating room on 09/21/2017 and underwent the above named procedures.    He was given perioperative antibiotics:  Anti-infectives (From admission, onward)   Start     Dose/Rate Route Frequency Ordered Stop   09/22/17 1130  ceFAZolin (ANCEF) IVPB 1 g/50 mL premix     1 g 100 mL/hr over 30 Minutes Intravenous Every 6 hours 09/22/17 0726 09/22/17 1729   09/22/17 0000  cephALEXin (KEFLEX) 500 MG capsule     500 mg Oral 2 times daily 09/22/17 0726     09/21/17 2330  ceFAZolin (ANCEF) IVPB 2g/100 mL premix  Status:  Discontinued     2 g 200 mL/hr over 30 Minutes Intravenous Every 6 hours 09/21/17 2328 09/22/17 0726   09/21/17 1804  ceFAZolin (ANCEF) 1-4 GM/50ML-% IVPB    Note to Pharmacy:  Laurita Quint   : cabinet override      09/21/17 1804 09/21/17 1836   09/21/17 1530  ceFAZolin (ANCEF) IVPB 1 g/50 mL premix     1 g 100 mL/hr over 30 Minutes Intravenous  Once 09/21/17 1526 09/21/17 1658    .  He was given sequential compression devices, early ambulation, and ASA/Plavix  for DVT prophylaxis.  He benefited maximally from the hospital stay and there were no complications.    Recent vital signs:  Vitals:   09/22/17 0257 09/22/17 0641  BP: 124/70 (!) 116/52  Pulse: 66 (!) 56  Resp: 17 17   Temp: 98.1 F (36.7 C) 98 F (36.7 C)  SpO2: 98% 98%    Recent laboratory studies:  Lab Results  Component Value Date   HGB 11.2 (L) 09/21/2017   HGB 11.1 (L) 09/21/2017   Lab Results  Component Value Date   WBC 10.4 09/21/2017   PLT 147 (L) 09/21/2017   No results found for: INR Lab Results  Component Value Date   NA 141 09/21/2017   K 4.2 09/21/2017   CL 107 09/21/2017   CO2 22 09/21/2017   BUN 11 09/21/2017   CREATININE 1.00 09/21/2017   GLUCOSE 105 (H) 09/21/2017    Discharge Medications:   Allergies as of 09/22/2017   No Known Allergies     Medication List    TAKE these medications   acetaminophen 325 MG tablet Commonly known as:  TYLENOL Take 2 tablets (650 mg total) by mouth 4 (four) times daily -  before meals and at bedtime.   aspirin 81 MG chewable tablet Chew 81 mg by mouth daily.   atorvastatin 20 MG tablet Commonly known as:  LIPITOR Take 20 mg by mouth daily.   cephALEXin 500 MG capsule Commonly known as:  KEFLEX Take 1 capsule (500 mg total) by mouth 2 (two) times daily. What changed:  when to take this   clopidogrel 75 MG tablet Commonly known as:  PLAVIX Take 75 mg by mouth daily.       Diagnostic Studies: Dg Hand Complete Left  Result Date: 09/21/2017 CLINICAL DATA:  Table saw injury EXAM: LEFT HAND - COMPLETE 3+ VIEW COMPARISON:  The same day film 09/21/2017 FINDINGS: As previously described there is a traumatic destruction along the dorsal surface of the middle and distal phalanx of the fifth digit. There is involvement of the distal and proximal interphalangeal joint of the fifth digit. There is comminution of the distal phalanx. Soft tissue injury. IMPRESSION: A traumatic injury to the middle and distal phalanx of the fifth digit with bone loss from both these phalanges in comminution of distal phalanx. Electronically Signed   By: Suzy Bouchard M.D.   On: 09/21/2017 16:26    Disposition: Discharge disposition: 01-Home or Self  Care         Follow-up Information    Milly Jakob, MD Follow up.   Specialty:  Orthopedic Surgery Why:  office will call you on Monday to arrange your follow-up for later this week Contact information: [place] [address] [phone]            Signed: Jolyn Nap 09/22/2017, 7:28 AM

## 2017-09-24 ENCOUNTER — Encounter (HOSPITAL_COMMUNITY): Payer: Self-pay | Admitting: Orthopedic Surgery

## 2017-09-24 DIAGNOSIS — S41119A Laceration without foreign body of unspecified upper arm, initial encounter: Secondary | ICD-10-CM | POA: Diagnosis not present

## 2017-09-24 DIAGNOSIS — Z09 Encounter for follow-up examination after completed treatment for conditions other than malignant neoplasm: Secondary | ICD-10-CM | POA: Diagnosis not present

## 2017-09-24 DIAGNOSIS — Z6820 Body mass index (BMI) 20.0-20.9, adult: Secondary | ICD-10-CM | POA: Diagnosis not present

## 2017-10-01 DIAGNOSIS — S66120D Laceration of flexor muscle, fascia and tendon of right index finger at wrist and hand level, subsequent encounter: Secondary | ICD-10-CM | POA: Diagnosis not present

## 2017-10-01 DIAGNOSIS — R200 Anesthesia of skin: Secondary | ICD-10-CM | POA: Diagnosis not present

## 2017-10-01 DIAGNOSIS — R202 Paresthesia of skin: Secondary | ICD-10-CM | POA: Diagnosis not present

## 2017-10-01 DIAGNOSIS — S61401D Unspecified open wound of right hand, subsequent encounter: Secondary | ICD-10-CM | POA: Diagnosis not present

## 2017-10-04 DIAGNOSIS — R200 Anesthesia of skin: Secondary | ICD-10-CM | POA: Diagnosis not present

## 2017-10-04 DIAGNOSIS — S66120D Laceration of flexor muscle, fascia and tendon of right index finger at wrist and hand level, subsequent encounter: Secondary | ICD-10-CM | POA: Diagnosis not present

## 2017-10-04 DIAGNOSIS — S61401D Unspecified open wound of right hand, subsequent encounter: Secondary | ICD-10-CM | POA: Diagnosis not present

## 2017-10-04 DIAGNOSIS — R202 Paresthesia of skin: Secondary | ICD-10-CM | POA: Diagnosis not present

## 2017-10-08 DIAGNOSIS — S66120D Laceration of flexor muscle, fascia and tendon of right index finger at wrist and hand level, subsequent encounter: Secondary | ICD-10-CM | POA: Diagnosis not present

## 2017-10-08 DIAGNOSIS — S61401D Unspecified open wound of right hand, subsequent encounter: Secondary | ICD-10-CM | POA: Diagnosis not present

## 2017-10-08 DIAGNOSIS — R202 Paresthesia of skin: Secondary | ICD-10-CM | POA: Diagnosis not present

## 2017-10-08 DIAGNOSIS — R200 Anesthesia of skin: Secondary | ICD-10-CM | POA: Diagnosis not present

## 2017-10-11 DIAGNOSIS — R200 Anesthesia of skin: Secondary | ICD-10-CM | POA: Diagnosis not present

## 2017-10-11 DIAGNOSIS — S61401D Unspecified open wound of right hand, subsequent encounter: Secondary | ICD-10-CM | POA: Diagnosis not present

## 2017-10-11 DIAGNOSIS — S66120D Laceration of flexor muscle, fascia and tendon of right index finger at wrist and hand level, subsequent encounter: Secondary | ICD-10-CM | POA: Diagnosis not present

## 2017-10-11 DIAGNOSIS — R202 Paresthesia of skin: Secondary | ICD-10-CM | POA: Diagnosis not present

## 2017-10-15 DIAGNOSIS — R200 Anesthesia of skin: Secondary | ICD-10-CM | POA: Diagnosis not present

## 2017-10-15 DIAGNOSIS — R202 Paresthesia of skin: Secondary | ICD-10-CM | POA: Diagnosis not present

## 2017-10-15 DIAGNOSIS — S66120D Laceration of flexor muscle, fascia and tendon of right index finger at wrist and hand level, subsequent encounter: Secondary | ICD-10-CM | POA: Diagnosis not present

## 2017-10-15 DIAGNOSIS — S61401D Unspecified open wound of right hand, subsequent encounter: Secondary | ICD-10-CM | POA: Diagnosis not present

## 2017-10-19 DIAGNOSIS — R200 Anesthesia of skin: Secondary | ICD-10-CM | POA: Diagnosis not present

## 2017-10-19 DIAGNOSIS — R202 Paresthesia of skin: Secondary | ICD-10-CM | POA: Diagnosis not present

## 2017-10-19 DIAGNOSIS — S61401D Unspecified open wound of right hand, subsequent encounter: Secondary | ICD-10-CM | POA: Diagnosis not present

## 2017-10-19 DIAGNOSIS — S66120D Laceration of flexor muscle, fascia and tendon of right index finger at wrist and hand level, subsequent encounter: Secondary | ICD-10-CM | POA: Diagnosis not present

## 2017-10-22 DIAGNOSIS — S66120D Laceration of flexor muscle, fascia and tendon of right index finger at wrist and hand level, subsequent encounter: Secondary | ICD-10-CM | POA: Diagnosis not present

## 2017-10-22 DIAGNOSIS — R200 Anesthesia of skin: Secondary | ICD-10-CM | POA: Diagnosis not present

## 2017-10-22 DIAGNOSIS — R202 Paresthesia of skin: Secondary | ICD-10-CM | POA: Diagnosis not present

## 2017-10-22 DIAGNOSIS — S61401D Unspecified open wound of right hand, subsequent encounter: Secondary | ICD-10-CM | POA: Diagnosis not present

## 2017-10-25 DIAGNOSIS — S61401D Unspecified open wound of right hand, subsequent encounter: Secondary | ICD-10-CM | POA: Diagnosis not present

## 2017-10-25 DIAGNOSIS — R200 Anesthesia of skin: Secondary | ICD-10-CM | POA: Diagnosis not present

## 2017-10-25 DIAGNOSIS — R202 Paresthesia of skin: Secondary | ICD-10-CM | POA: Diagnosis not present

## 2017-10-25 DIAGNOSIS — S66120D Laceration of flexor muscle, fascia and tendon of right index finger at wrist and hand level, subsequent encounter: Secondary | ICD-10-CM | POA: Diagnosis not present

## 2017-10-29 DIAGNOSIS — R202 Paresthesia of skin: Secondary | ICD-10-CM | POA: Diagnosis not present

## 2017-10-29 DIAGNOSIS — R200 Anesthesia of skin: Secondary | ICD-10-CM | POA: Diagnosis not present

## 2017-10-29 DIAGNOSIS — S61401D Unspecified open wound of right hand, subsequent encounter: Secondary | ICD-10-CM | POA: Diagnosis not present

## 2017-10-29 DIAGNOSIS — S66120D Laceration of flexor muscle, fascia and tendon of right index finger at wrist and hand level, subsequent encounter: Secondary | ICD-10-CM | POA: Diagnosis not present

## 2017-11-01 DIAGNOSIS — S6440XA Injury of digital nerve of unspecified finger, initial encounter: Secondary | ICD-10-CM | POA: Diagnosis not present

## 2017-11-01 DIAGNOSIS — S66822A Laceration of other specified muscles, fascia and tendons at wrist and hand level, left hand, initial encounter: Secondary | ICD-10-CM | POA: Diagnosis not present

## 2017-11-02 DIAGNOSIS — R200 Anesthesia of skin: Secondary | ICD-10-CM | POA: Diagnosis not present

## 2017-11-02 DIAGNOSIS — S66120D Laceration of flexor muscle, fascia and tendon of right index finger at wrist and hand level, subsequent encounter: Secondary | ICD-10-CM | POA: Diagnosis not present

## 2017-11-02 DIAGNOSIS — S61401D Unspecified open wound of right hand, subsequent encounter: Secondary | ICD-10-CM | POA: Diagnosis not present

## 2017-11-02 DIAGNOSIS — R202 Paresthesia of skin: Secondary | ICD-10-CM | POA: Diagnosis not present

## 2017-11-05 DIAGNOSIS — Z720 Tobacco use: Secondary | ICD-10-CM | POA: Diagnosis not present

## 2017-11-05 DIAGNOSIS — R2232 Localized swelling, mass and lump, left upper limb: Secondary | ICD-10-CM | POA: Diagnosis not present

## 2017-11-05 DIAGNOSIS — E785 Hyperlipidemia, unspecified: Secondary | ICD-10-CM | POA: Diagnosis not present

## 2017-11-05 DIAGNOSIS — Z6820 Body mass index (BMI) 20.0-20.9, adult: Secondary | ICD-10-CM | POA: Diagnosis not present

## 2017-11-05 DIAGNOSIS — Z79899 Other long term (current) drug therapy: Secondary | ICD-10-CM | POA: Diagnosis not present

## 2017-11-05 DIAGNOSIS — Z125 Encounter for screening for malignant neoplasm of prostate: Secondary | ICD-10-CM | POA: Diagnosis not present

## 2017-11-05 DIAGNOSIS — M5432 Sciatica, left side: Secondary | ICD-10-CM | POA: Diagnosis not present

## 2017-11-05 DIAGNOSIS — M1990 Unspecified osteoarthritis, unspecified site: Secondary | ICD-10-CM | POA: Diagnosis not present

## 2017-11-05 DIAGNOSIS — I2510 Atherosclerotic heart disease of native coronary artery without angina pectoris: Secondary | ICD-10-CM | POA: Diagnosis not present

## 2017-11-05 DIAGNOSIS — J449 Chronic obstructive pulmonary disease, unspecified: Secondary | ICD-10-CM | POA: Diagnosis not present

## 2017-11-06 DIAGNOSIS — S61401D Unspecified open wound of right hand, subsequent encounter: Secondary | ICD-10-CM | POA: Diagnosis not present

## 2017-11-06 DIAGNOSIS — S66120D Laceration of flexor muscle, fascia and tendon of right index finger at wrist and hand level, subsequent encounter: Secondary | ICD-10-CM | POA: Diagnosis not present

## 2017-11-06 DIAGNOSIS — R200 Anesthesia of skin: Secondary | ICD-10-CM | POA: Diagnosis not present

## 2017-11-06 DIAGNOSIS — R202 Paresthesia of skin: Secondary | ICD-10-CM | POA: Diagnosis not present

## 2017-11-09 DIAGNOSIS — S61401D Unspecified open wound of right hand, subsequent encounter: Secondary | ICD-10-CM | POA: Diagnosis not present

## 2017-11-09 DIAGNOSIS — R200 Anesthesia of skin: Secondary | ICD-10-CM | POA: Diagnosis not present

## 2017-11-09 DIAGNOSIS — S66120D Laceration of flexor muscle, fascia and tendon of right index finger at wrist and hand level, subsequent encounter: Secondary | ICD-10-CM | POA: Diagnosis not present

## 2017-11-09 DIAGNOSIS — R202 Paresthesia of skin: Secondary | ICD-10-CM | POA: Diagnosis not present

## 2017-11-13 DIAGNOSIS — S61401D Unspecified open wound of right hand, subsequent encounter: Secondary | ICD-10-CM | POA: Diagnosis not present

## 2017-11-13 DIAGNOSIS — S66120D Laceration of flexor muscle, fascia and tendon of right index finger at wrist and hand level, subsequent encounter: Secondary | ICD-10-CM | POA: Diagnosis not present

## 2017-11-13 DIAGNOSIS — R202 Paresthesia of skin: Secondary | ICD-10-CM | POA: Diagnosis not present

## 2017-11-13 DIAGNOSIS — R200 Anesthesia of skin: Secondary | ICD-10-CM | POA: Diagnosis not present

## 2017-11-15 DIAGNOSIS — S66120D Laceration of flexor muscle, fascia and tendon of right index finger at wrist and hand level, subsequent encounter: Secondary | ICD-10-CM | POA: Diagnosis not present

## 2017-11-15 DIAGNOSIS — S61401D Unspecified open wound of right hand, subsequent encounter: Secondary | ICD-10-CM | POA: Diagnosis not present

## 2017-11-15 DIAGNOSIS — R202 Paresthesia of skin: Secondary | ICD-10-CM | POA: Diagnosis not present

## 2017-11-15 DIAGNOSIS — R200 Anesthesia of skin: Secondary | ICD-10-CM | POA: Diagnosis not present

## 2017-11-20 DIAGNOSIS — R202 Paresthesia of skin: Secondary | ICD-10-CM | POA: Diagnosis not present

## 2017-11-20 DIAGNOSIS — S66120D Laceration of flexor muscle, fascia and tendon of right index finger at wrist and hand level, subsequent encounter: Secondary | ICD-10-CM | POA: Diagnosis not present

## 2017-11-20 DIAGNOSIS — R200 Anesthesia of skin: Secondary | ICD-10-CM | POA: Diagnosis not present

## 2017-11-20 DIAGNOSIS — S61401D Unspecified open wound of right hand, subsequent encounter: Secondary | ICD-10-CM | POA: Diagnosis not present

## 2017-11-22 DIAGNOSIS — M25442 Effusion, left hand: Secondary | ICD-10-CM | POA: Diagnosis not present

## 2017-11-22 DIAGNOSIS — R202 Paresthesia of skin: Secondary | ICD-10-CM | POA: Diagnosis not present

## 2017-11-22 DIAGNOSIS — R200 Anesthesia of skin: Secondary | ICD-10-CM | POA: Diagnosis not present

## 2017-11-22 DIAGNOSIS — M25642 Stiffness of left hand, not elsewhere classified: Secondary | ICD-10-CM | POA: Diagnosis not present

## 2017-11-22 DIAGNOSIS — S61401D Unspecified open wound of right hand, subsequent encounter: Secondary | ICD-10-CM | POA: Diagnosis not present

## 2017-11-22 DIAGNOSIS — S66120D Laceration of flexor muscle, fascia and tendon of right index finger at wrist and hand level, subsequent encounter: Secondary | ICD-10-CM | POA: Diagnosis not present

## 2017-11-27 DIAGNOSIS — R202 Paresthesia of skin: Secondary | ICD-10-CM | POA: Diagnosis not present

## 2017-11-27 DIAGNOSIS — S66120D Laceration of flexor muscle, fascia and tendon of right index finger at wrist and hand level, subsequent encounter: Secondary | ICD-10-CM | POA: Diagnosis not present

## 2017-11-27 DIAGNOSIS — S61401D Unspecified open wound of right hand, subsequent encounter: Secondary | ICD-10-CM | POA: Diagnosis not present

## 2017-11-27 DIAGNOSIS — R200 Anesthesia of skin: Secondary | ICD-10-CM | POA: Diagnosis not present

## 2017-11-29 DIAGNOSIS — S61401D Unspecified open wound of right hand, subsequent encounter: Secondary | ICD-10-CM | POA: Diagnosis not present

## 2017-11-29 DIAGNOSIS — R200 Anesthesia of skin: Secondary | ICD-10-CM | POA: Diagnosis not present

## 2017-11-29 DIAGNOSIS — R202 Paresthesia of skin: Secondary | ICD-10-CM | POA: Diagnosis not present

## 2017-11-29 DIAGNOSIS — S66120D Laceration of flexor muscle, fascia and tendon of right index finger at wrist and hand level, subsequent encounter: Secondary | ICD-10-CM | POA: Diagnosis not present

## 2017-12-04 DIAGNOSIS — R202 Paresthesia of skin: Secondary | ICD-10-CM | POA: Diagnosis not present

## 2017-12-04 DIAGNOSIS — S66120D Laceration of flexor muscle, fascia and tendon of right index finger at wrist and hand level, subsequent encounter: Secondary | ICD-10-CM | POA: Diagnosis not present

## 2017-12-04 DIAGNOSIS — S61401D Unspecified open wound of right hand, subsequent encounter: Secondary | ICD-10-CM | POA: Diagnosis not present

## 2017-12-04 DIAGNOSIS — R200 Anesthesia of skin: Secondary | ICD-10-CM | POA: Diagnosis not present

## 2017-12-06 DIAGNOSIS — S66120D Laceration of flexor muscle, fascia and tendon of right index finger at wrist and hand level, subsequent encounter: Secondary | ICD-10-CM | POA: Diagnosis not present

## 2017-12-06 DIAGNOSIS — R202 Paresthesia of skin: Secondary | ICD-10-CM | POA: Diagnosis not present

## 2017-12-06 DIAGNOSIS — S61401D Unspecified open wound of right hand, subsequent encounter: Secondary | ICD-10-CM | POA: Diagnosis not present

## 2017-12-06 DIAGNOSIS — R200 Anesthesia of skin: Secondary | ICD-10-CM | POA: Diagnosis not present

## 2017-12-10 DIAGNOSIS — D649 Anemia, unspecified: Secondary | ICD-10-CM | POA: Diagnosis not present

## 2017-12-24 DIAGNOSIS — R2232 Localized swelling, mass and lump, left upper limb: Secondary | ICD-10-CM | POA: Diagnosis not present

## 2017-12-28 DIAGNOSIS — D1722 Benign lipomatous neoplasm of skin and subcutaneous tissue of left arm: Secondary | ICD-10-CM | POA: Diagnosis not present

## 2018-01-07 DIAGNOSIS — S66822A Laceration of other specified muscles, fascia and tendons at wrist and hand level, left hand, initial encounter: Secondary | ICD-10-CM | POA: Diagnosis not present

## 2018-01-07 DIAGNOSIS — S6440XA Injury of digital nerve of unspecified finger, initial encounter: Secondary | ICD-10-CM | POA: Diagnosis not present

## 2018-01-10 DIAGNOSIS — D2362 Other benign neoplasm of skin of left upper limb, including shoulder: Secondary | ICD-10-CM | POA: Diagnosis not present

## 2018-01-10 DIAGNOSIS — M5432 Sciatica, left side: Secondary | ICD-10-CM | POA: Diagnosis not present

## 2018-01-10 DIAGNOSIS — F17210 Nicotine dependence, cigarettes, uncomplicated: Secondary | ICD-10-CM | POA: Diagnosis not present

## 2018-01-10 DIAGNOSIS — D179 Benign lipomatous neoplasm, unspecified: Secondary | ICD-10-CM | POA: Diagnosis not present

## 2018-01-10 DIAGNOSIS — D1722 Benign lipomatous neoplasm of skin and subcutaneous tissue of left arm: Secondary | ICD-10-CM | POA: Diagnosis not present

## 2018-01-10 DIAGNOSIS — R2232 Localized swelling, mass and lump, left upper limb: Secondary | ICD-10-CM | POA: Diagnosis not present

## 2018-01-10 DIAGNOSIS — Z01810 Encounter for preprocedural cardiovascular examination: Secondary | ICD-10-CM

## 2018-01-10 DIAGNOSIS — M1990 Unspecified osteoarthritis, unspecified site: Secondary | ICD-10-CM | POA: Diagnosis not present

## 2018-01-10 DIAGNOSIS — E785 Hyperlipidemia, unspecified: Secondary | ICD-10-CM | POA: Diagnosis not present

## 2018-01-10 DIAGNOSIS — M7989 Other specified soft tissue disorders: Secondary | ICD-10-CM | POA: Diagnosis not present

## 2018-01-10 DIAGNOSIS — J449 Chronic obstructive pulmonary disease, unspecified: Secondary | ICD-10-CM | POA: Diagnosis not present

## 2018-01-10 DIAGNOSIS — I2510 Atherosclerotic heart disease of native coronary artery without angina pectoris: Secondary | ICD-10-CM | POA: Diagnosis not present

## 2018-01-15 DIAGNOSIS — Z09 Encounter for follow-up examination after completed treatment for conditions other than malignant neoplasm: Secondary | ICD-10-CM | POA: Diagnosis not present

## 2018-01-15 DIAGNOSIS — Z681 Body mass index (BMI) 19 or less, adult: Secondary | ICD-10-CM | POA: Diagnosis not present

## 2018-02-13 DIAGNOSIS — Z0000 Encounter for general adult medical examination without abnormal findings: Secondary | ICD-10-CM | POA: Diagnosis not present

## 2018-02-13 DIAGNOSIS — Z9181 History of falling: Secondary | ICD-10-CM | POA: Diagnosis not present

## 2018-02-13 DIAGNOSIS — E785 Hyperlipidemia, unspecified: Secondary | ICD-10-CM | POA: Diagnosis not present

## 2018-02-13 DIAGNOSIS — Z125 Encounter for screening for malignant neoplasm of prostate: Secondary | ICD-10-CM | POA: Diagnosis not present

## 2018-02-13 DIAGNOSIS — Z139 Encounter for screening, unspecified: Secondary | ICD-10-CM | POA: Diagnosis not present

## 2018-02-13 DIAGNOSIS — Z1339 Encounter for screening examination for other mental health and behavioral disorders: Secondary | ICD-10-CM | POA: Diagnosis not present

## 2018-03-11 DIAGNOSIS — M1990 Unspecified osteoarthritis, unspecified site: Secondary | ICD-10-CM | POA: Diagnosis not present

## 2018-03-11 DIAGNOSIS — Z681 Body mass index (BMI) 19 or less, adult: Secondary | ICD-10-CM | POA: Diagnosis not present

## 2018-03-11 DIAGNOSIS — Z79899 Other long term (current) drug therapy: Secondary | ICD-10-CM | POA: Diagnosis not present

## 2018-03-11 DIAGNOSIS — M5432 Sciatica, left side: Secondary | ICD-10-CM | POA: Diagnosis not present

## 2018-03-11 DIAGNOSIS — E785 Hyperlipidemia, unspecified: Secondary | ICD-10-CM | POA: Diagnosis not present

## 2018-03-11 DIAGNOSIS — Z720 Tobacco use: Secondary | ICD-10-CM | POA: Diagnosis not present

## 2018-03-11 DIAGNOSIS — I2510 Atherosclerotic heart disease of native coronary artery without angina pectoris: Secondary | ICD-10-CM | POA: Diagnosis not present

## 2018-03-11 DIAGNOSIS — J449 Chronic obstructive pulmonary disease, unspecified: Secondary | ICD-10-CM | POA: Diagnosis not present

## 2018-07-08 IMAGING — DX DG HAND COMPLETE 3+V*L*
1 series · 3 of 3 positions shown · non-contrast
Comparison: The same day film 09/21/2017

CLINICAL DATA: Table saw injury

EXAM:
LEFT HAND - COMPLETE 3+ VIEW

[Series 1: hand · 0.14mm/px · 3 of 3 slices shown]
[im 1/3]
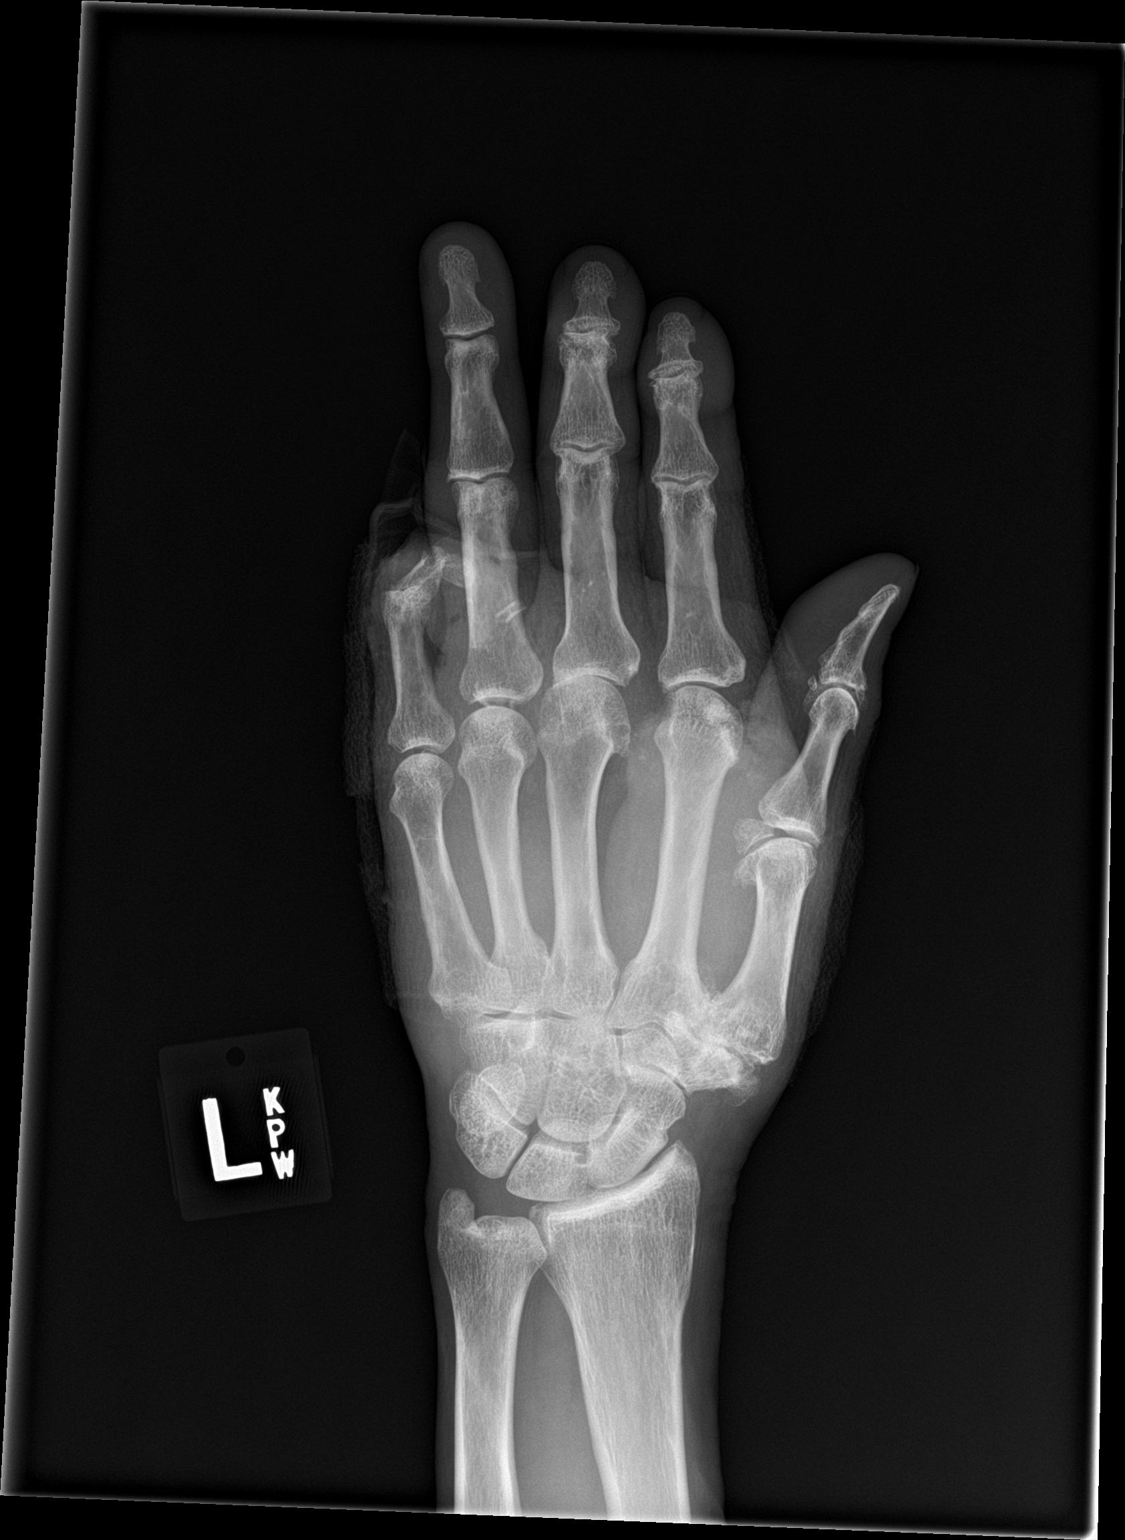
[im 2/3]
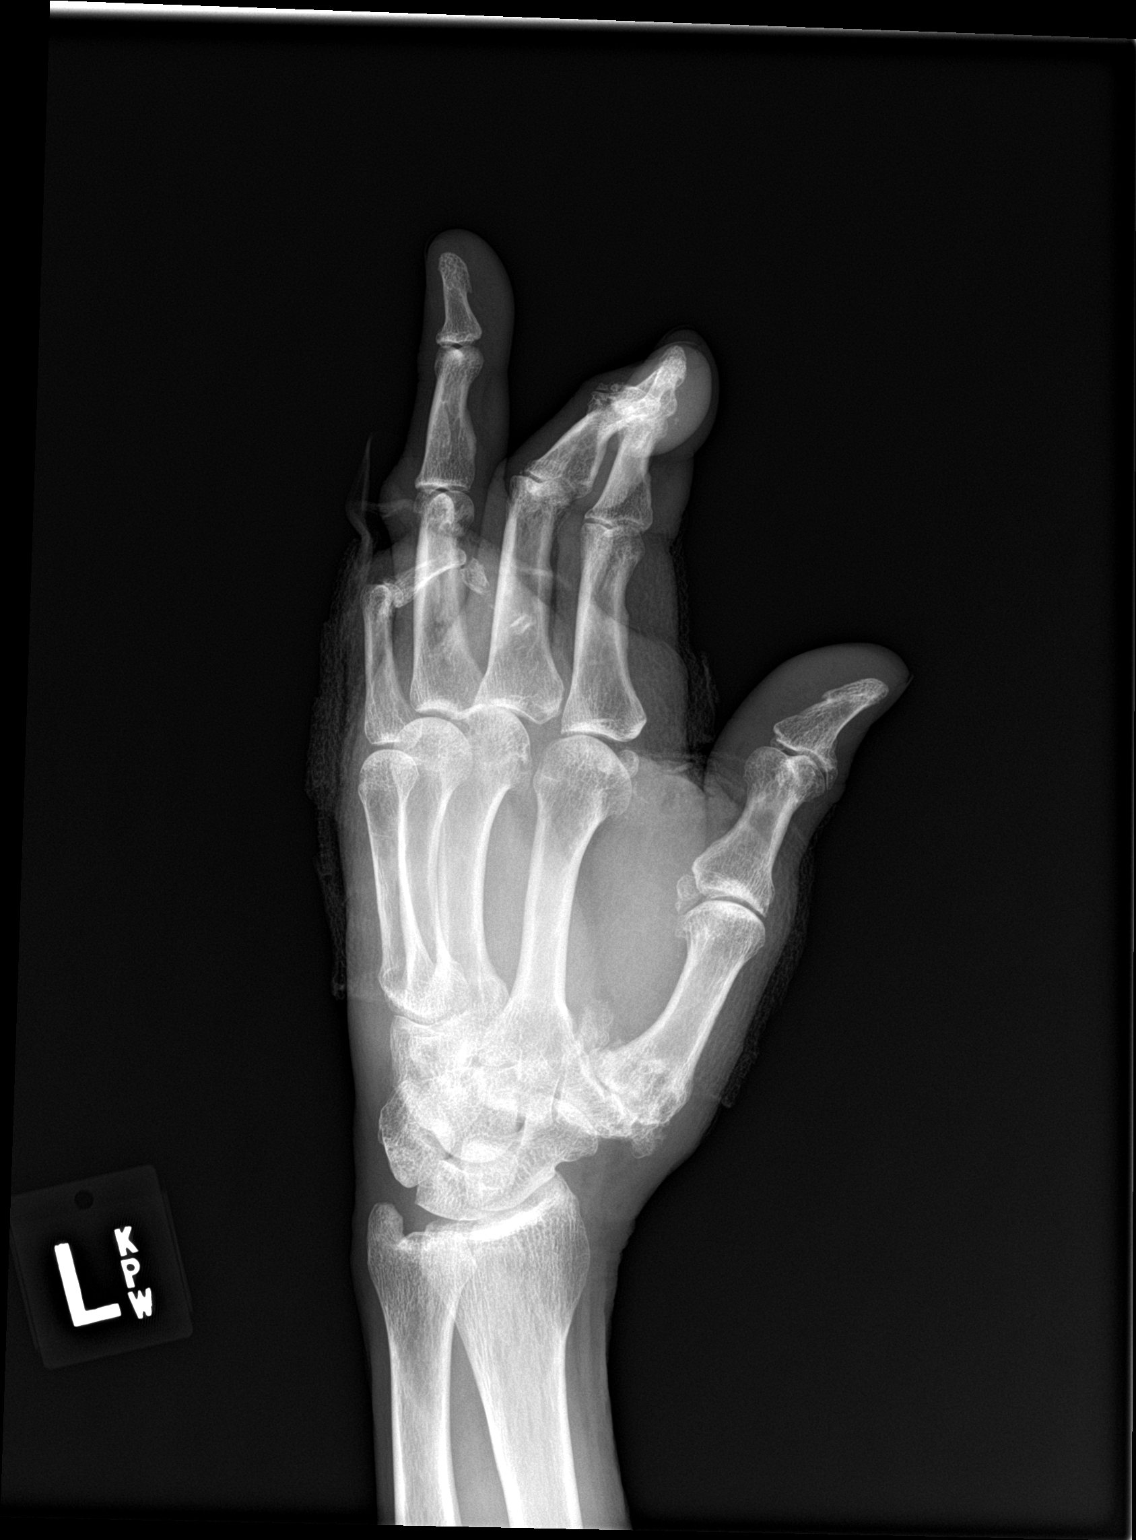
[im 3/3]
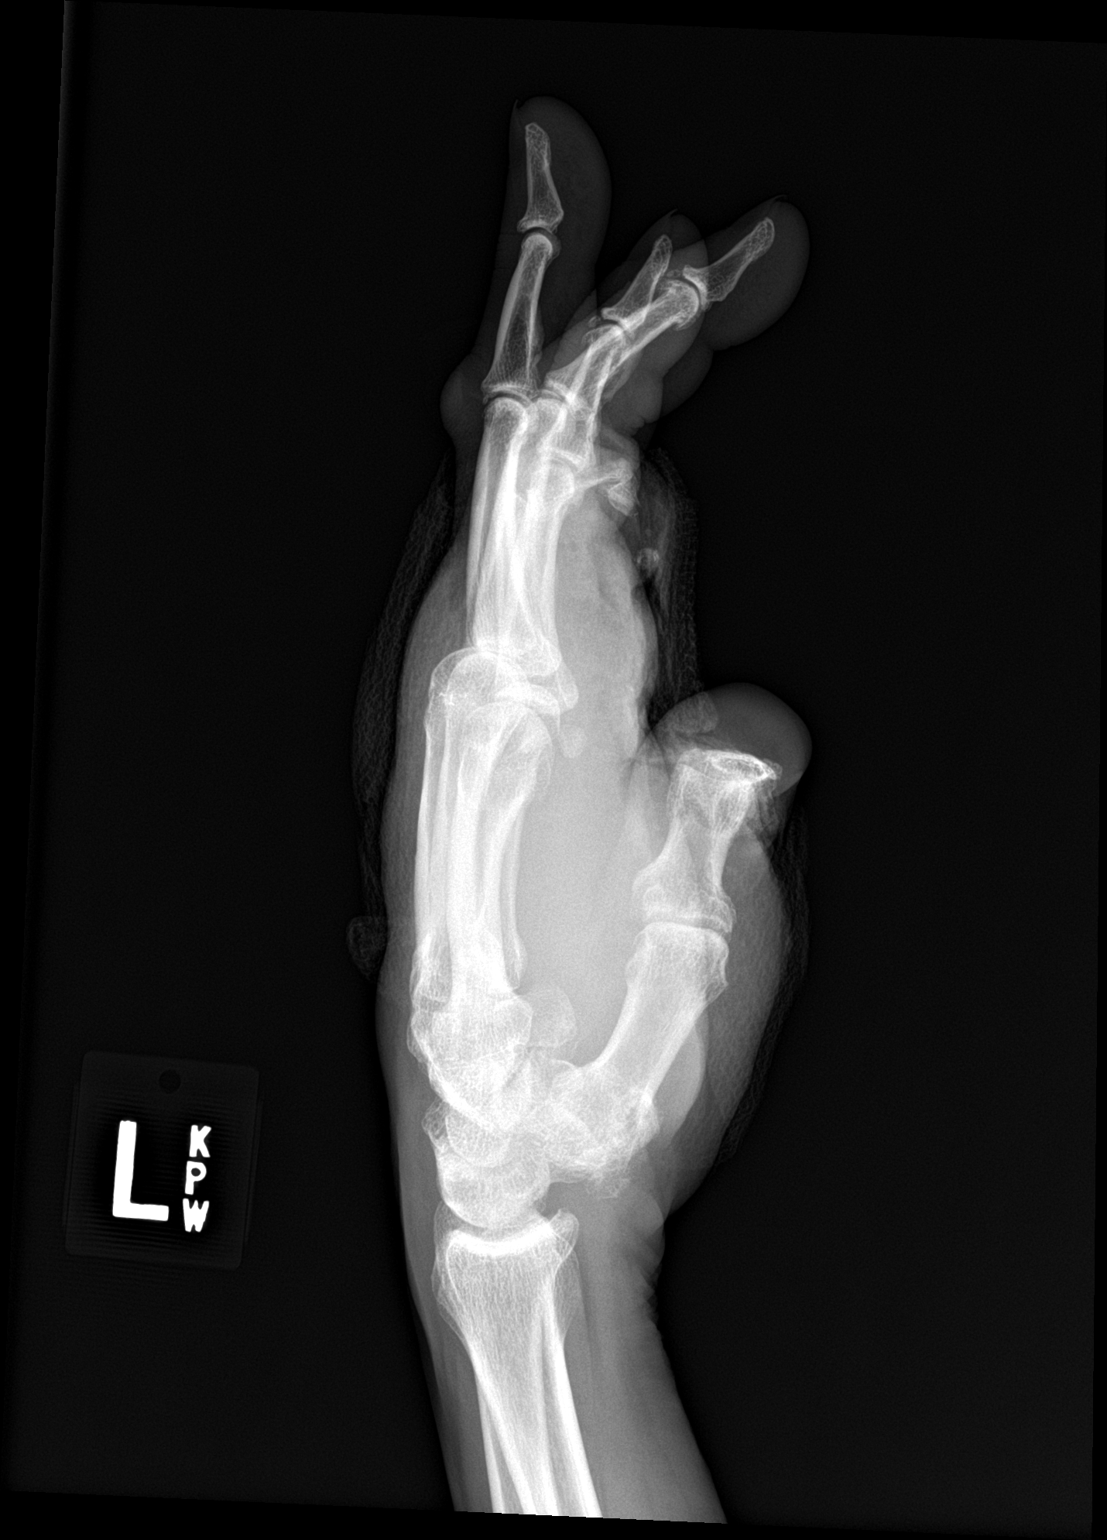

[3 of 3 positions shown; findings below may reference images not displayed]

FINDINGS: As previously described there is a traumatic destruction along the
dorsal surface of the middle and distal phalanx of the fifth digit.
There is involvement of the distal and proximal interphalangeal
joint of the fifth digit. There is comminution of the distal
phalanx. Soft tissue injury.
IMPRESSION: A traumatic injury to the middle and distal phalanx of the fifth
digit with bone loss from both these phalanges in comminution of
distal phalanx.

## 2018-11-04 DIAGNOSIS — E785 Hyperlipidemia, unspecified: Secondary | ICD-10-CM | POA: Diagnosis not present

## 2018-11-04 DIAGNOSIS — I2510 Atherosclerotic heart disease of native coronary artery without angina pectoris: Secondary | ICD-10-CM | POA: Diagnosis not present

## 2018-11-04 DIAGNOSIS — G629 Polyneuropathy, unspecified: Secondary | ICD-10-CM | POA: Diagnosis not present

## 2018-11-04 DIAGNOSIS — Z79899 Other long term (current) drug therapy: Secondary | ICD-10-CM | POA: Diagnosis not present

## 2018-11-04 DIAGNOSIS — M1990 Unspecified osteoarthritis, unspecified site: Secondary | ICD-10-CM | POA: Diagnosis not present

## 2018-11-04 DIAGNOSIS — J449 Chronic obstructive pulmonary disease, unspecified: Secondary | ICD-10-CM | POA: Diagnosis not present

## 2018-11-04 DIAGNOSIS — M1612 Unilateral primary osteoarthritis, left hip: Secondary | ICD-10-CM | POA: Diagnosis not present

## 2018-11-04 DIAGNOSIS — R413 Other amnesia: Secondary | ICD-10-CM | POA: Diagnosis not present

## 2018-11-04 DIAGNOSIS — M5432 Sciatica, left side: Secondary | ICD-10-CM | POA: Diagnosis not present

## 2019-02-18 DIAGNOSIS — Z9181 History of falling: Secondary | ICD-10-CM | POA: Diagnosis not present

## 2019-02-18 DIAGNOSIS — Z1331 Encounter for screening for depression: Secondary | ICD-10-CM | POA: Diagnosis not present

## 2019-02-18 DIAGNOSIS — Z125 Encounter for screening for malignant neoplasm of prostate: Secondary | ICD-10-CM | POA: Diagnosis not present

## 2019-02-18 DIAGNOSIS — Z139 Encounter for screening, unspecified: Secondary | ICD-10-CM | POA: Diagnosis not present

## 2019-02-18 DIAGNOSIS — E785 Hyperlipidemia, unspecified: Secondary | ICD-10-CM | POA: Diagnosis not present

## 2019-02-18 DIAGNOSIS — Z0000 Encounter for general adult medical examination without abnormal findings: Secondary | ICD-10-CM | POA: Diagnosis not present

## 2019-03-05 DIAGNOSIS — J449 Chronic obstructive pulmonary disease, unspecified: Secondary | ICD-10-CM | POA: Diagnosis not present

## 2019-03-05 DIAGNOSIS — Z125 Encounter for screening for malignant neoplasm of prostate: Secondary | ICD-10-CM | POA: Diagnosis not present

## 2019-03-05 DIAGNOSIS — E785 Hyperlipidemia, unspecified: Secondary | ICD-10-CM | POA: Diagnosis not present

## 2019-03-05 DIAGNOSIS — I2510 Atherosclerotic heart disease of native coronary artery without angina pectoris: Secondary | ICD-10-CM | POA: Diagnosis not present

## 2019-03-05 DIAGNOSIS — Z79899 Other long term (current) drug therapy: Secondary | ICD-10-CM | POA: Diagnosis not present

## 2019-03-05 DIAGNOSIS — M1990 Unspecified osteoarthritis, unspecified site: Secondary | ICD-10-CM | POA: Diagnosis not present

## 2019-03-05 DIAGNOSIS — M5432 Sciatica, left side: Secondary | ICD-10-CM | POA: Diagnosis not present

## 2019-03-05 DIAGNOSIS — Z720 Tobacco use: Secondary | ICD-10-CM | POA: Diagnosis not present

## 2019-07-03 DIAGNOSIS — J449 Chronic obstructive pulmonary disease, unspecified: Secondary | ICD-10-CM | POA: Diagnosis not present

## 2019-07-03 DIAGNOSIS — I2510 Atherosclerotic heart disease of native coronary artery without angina pectoris: Secondary | ICD-10-CM | POA: Diagnosis not present

## 2019-07-03 DIAGNOSIS — Z79899 Other long term (current) drug therapy: Secondary | ICD-10-CM | POA: Diagnosis not present

## 2019-07-03 DIAGNOSIS — E785 Hyperlipidemia, unspecified: Secondary | ICD-10-CM | POA: Diagnosis not present

## 2019-07-03 DIAGNOSIS — M1990 Unspecified osteoarthritis, unspecified site: Secondary | ICD-10-CM | POA: Diagnosis not present

## 2019-11-11 DIAGNOSIS — E785 Hyperlipidemia, unspecified: Secondary | ICD-10-CM | POA: Diagnosis not present

## 2019-11-11 DIAGNOSIS — Z79899 Other long term (current) drug therapy: Secondary | ICD-10-CM | POA: Diagnosis not present

## 2019-11-11 DIAGNOSIS — M1990 Unspecified osteoarthritis, unspecified site: Secondary | ICD-10-CM | POA: Diagnosis not present

## 2019-11-11 DIAGNOSIS — I2510 Atherosclerotic heart disease of native coronary artery without angina pectoris: Secondary | ICD-10-CM | POA: Diagnosis not present

## 2019-11-11 DIAGNOSIS — J449 Chronic obstructive pulmonary disease, unspecified: Secondary | ICD-10-CM | POA: Diagnosis not present

## 2019-12-09 DIAGNOSIS — G936 Cerebral edema: Secondary | ICD-10-CM | POA: Diagnosis not present

## 2019-12-09 DIAGNOSIS — Z515 Encounter for palliative care: Secondary | ICD-10-CM | POA: Diagnosis not present

## 2019-12-09 DIAGNOSIS — R000 Tachycardia, unspecified: Secondary | ICD-10-CM | POA: Diagnosis not present

## 2019-12-09 DIAGNOSIS — I161 Hypertensive emergency: Secondary | ICD-10-CM | POA: Diagnosis not present

## 2019-12-09 DIAGNOSIS — R29810 Facial weakness: Secondary | ICD-10-CM | POA: Diagnosis not present

## 2019-12-09 DIAGNOSIS — I10 Essential (primary) hypertension: Secondary | ICD-10-CM | POA: Diagnosis not present

## 2019-12-09 DIAGNOSIS — I619 Nontraumatic intracerebral hemorrhage, unspecified: Secondary | ICD-10-CM | POA: Diagnosis not present

## 2019-12-09 DIAGNOSIS — I609 Nontraumatic subarachnoid hemorrhage, unspecified: Secondary | ICD-10-CM | POA: Diagnosis not present

## 2019-12-09 DIAGNOSIS — Z4682 Encounter for fitting and adjustment of non-vascular catheter: Secondary | ICD-10-CM | POA: Diagnosis not present

## 2019-12-09 DIAGNOSIS — J988 Other specified respiratory disorders: Secondary | ICD-10-CM | POA: Diagnosis not present

## 2019-12-09 DIAGNOSIS — I629 Nontraumatic intracranial hemorrhage, unspecified: Secondary | ICD-10-CM | POA: Diagnosis not present

## 2019-12-09 DIAGNOSIS — J439 Emphysema, unspecified: Secondary | ICD-10-CM | POA: Diagnosis not present

## 2019-12-09 DIAGNOSIS — E785 Hyperlipidemia, unspecified: Secondary | ICD-10-CM | POA: Diagnosis not present

## 2019-12-09 DIAGNOSIS — Z66 Do not resuscitate: Secondary | ICD-10-CM | POA: Diagnosis not present

## 2019-12-09 DIAGNOSIS — R404 Transient alteration of awareness: Secondary | ICD-10-CM | POA: Diagnosis not present

## 2019-12-09 DIAGNOSIS — R9431 Abnormal electrocardiogram [ECG] [EKG]: Secondary | ICD-10-CM | POA: Diagnosis not present

## 2019-12-09 DIAGNOSIS — R4701 Aphasia: Secondary | ICD-10-CM | POA: Diagnosis not present

## 2019-12-09 DIAGNOSIS — Z4659 Encounter for fitting and adjustment of other gastrointestinal appliance and device: Secondary | ICD-10-CM | POA: Diagnosis not present

## 2019-12-09 DIAGNOSIS — G935 Compression of brain: Secondary | ICD-10-CM | POA: Diagnosis not present

## 2019-12-09 DIAGNOSIS — I618 Other nontraumatic intracerebral hemorrhage: Secondary | ICD-10-CM | POA: Diagnosis not present

## 2019-12-09 DIAGNOSIS — R4781 Slurred speech: Secondary | ICD-10-CM | POA: Diagnosis not present

## 2019-12-09 DIAGNOSIS — R0902 Hypoxemia: Secondary | ICD-10-CM | POA: Diagnosis not present

## 2019-12-09 DIAGNOSIS — J9600 Acute respiratory failure, unspecified whether with hypoxia or hypercapnia: Secondary | ICD-10-CM | POA: Diagnosis not present

## 2019-12-09 DIAGNOSIS — I6389 Other cerebral infarction: Secondary | ICD-10-CM | POA: Diagnosis not present

## 2019-12-09 DIAGNOSIS — R52 Pain, unspecified: Secondary | ICD-10-CM | POA: Diagnosis not present

## 2019-12-09 DIAGNOSIS — R2990 Unspecified symptoms and signs involving the nervous system: Secondary | ICD-10-CM | POA: Diagnosis not present

## 2019-12-09 DIAGNOSIS — I4891 Unspecified atrial fibrillation: Secondary | ICD-10-CM | POA: Diagnosis not present

## 2019-12-17 DEATH — deceased
